# Patient Record
Sex: Female | Born: 1960 | Race: White | Hispanic: No | Marital: Married | Smoking: Former smoker
Health system: Southern US, Community
[De-identification: ages and names within clinical notes are randomized; demographics above are authoritative.]

## PROBLEM LIST (undated history)

## (undated) DIAGNOSIS — K219 Gastro-esophageal reflux disease without esophagitis: Secondary | ICD-10-CM

## (undated) DIAGNOSIS — F419 Anxiety disorder, unspecified: Secondary | ICD-10-CM

## (undated) DIAGNOSIS — F32A Depression, unspecified: Secondary | ICD-10-CM

## (undated) DIAGNOSIS — F329 Major depressive disorder, single episode, unspecified: Secondary | ICD-10-CM

## (undated) DIAGNOSIS — M1990 Unspecified osteoarthritis, unspecified site: Secondary | ICD-10-CM

## (undated) DIAGNOSIS — Z8719 Personal history of other diseases of the digestive system: Secondary | ICD-10-CM

## (undated) DIAGNOSIS — E039 Hypothyroidism, unspecified: Secondary | ICD-10-CM

## (undated) DIAGNOSIS — G4700 Insomnia, unspecified: Secondary | ICD-10-CM

## (undated) HISTORY — PX: APPENDECTOMY: SHX54

## (undated) HISTORY — PX: CARPAL TUNNEL RELEASE: SHX101

## (undated) HISTORY — PX: SHOULDER ARTHROSCOPY: SHX128

## (undated) HISTORY — PX: COLONOSCOPY W/ BIOPSIES AND POLYPECTOMY: SHX1376

## (undated) HISTORY — PX: TARSAL TUNNEL RELEASE: SUR1099

## (undated) HISTORY — PX: PLANTAR FASCIA SURGERY: SHX746

## (undated) HISTORY — PX: CHOLECYSTECTOMY: SHX55

## (undated) HISTORY — PX: ABDOMINAL HYSTERECTOMY: SHX81

## (undated) HISTORY — PX: TONSILLECTOMY: SUR1361

## (undated) HISTORY — PX: KNEE ARTHROSCOPY: SHX127

---
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
## 1983-01-13 HISTORY — PX: TUBAL LIGATION: SHX77

## 2009-05-07 ENCOUNTER — Ambulatory Visit (HOSPITAL_COMMUNITY): Admission: RE | Admit: 2009-05-07 | Discharge: 2009-05-07 | Payer: Self-pay | Admitting: Cardiovascular Disease

## 2009-05-07 ENCOUNTER — Encounter (INDEPENDENT_AMBULATORY_CARE_PROVIDER_SITE_OTHER): Payer: Self-pay | Admitting: Cardiovascular Disease

## 2011-01-13 HISTORY — PX: ROUX-EN-Y GASTRIC BYPASS: SHX1104

## 2011-01-13 HISTORY — PX: HERNIA REPAIR: SHX51

## 2012-01-13 HISTORY — PX: BUNIONECTOMY WITH HAMMERTOE RECONSTRUCTION: SHX5600

## 2013-02-08 ENCOUNTER — Other Ambulatory Visit: Payer: Self-pay | Admitting: Orthopedic Surgery

## 2013-02-14 ENCOUNTER — Encounter (HOSPITAL_COMMUNITY): Payer: Self-pay | Admitting: Pharmacy Technician

## 2013-02-17 ENCOUNTER — Inpatient Hospital Stay (HOSPITAL_COMMUNITY)
Admission: RE | Admit: 2013-02-17 | Discharge: 2013-02-17 | Disposition: A | Discharge status: Home / Self Care | Payer: Self-pay | Source: Ambulatory Visit

## 2013-02-17 NOTE — Pre-Procedure Instructions (Signed)
Deon Pillingancy Conyer  02/17/2013   Your procedure is scheduled on:   Monday 02/27/13    [facility]  [facility] 3 at 530 AM.  Call this number if you have problems the morning of surgery: [phone]   Remember:   Do not eat food or drink liquids after midnight.   Take these medicines the morning of surgery with A SIP OF WATER:  LEXAPRO, HYDROCODONE, OMEPRAZOLE   Do not wear jewelry, make-up or nail polish.  Do not wear lotions, powders, or perfumes. You may wear deodorant.  Do not shave 48 hours prior to surgery. Men may shave face and neck.  Do not bring valuables to the hospital.  [facility] is not responsible                  for any belongings or valuables.               Contacts, dentures or bridgework may not be worn into surgery.  Leave suitcase in the car. After surgery it may be brought to your room.  For patients admitted to the hospital, discharge time is determined by your                treatment team.               Patients discharged the day of surgery will not be allowed to drive  home.  Name and phone number of your driver:   Special Instructions: [place] - Preparing for Surgery  Before surgery, you can play an important role.  Because skin is not sterile, your skin needs to be as free of germs as possible.  You can reduce the number of germs on you skin by washing with CHG (chlorahexidine gluconate) soap before surgery.  CHG is an antiseptic cleaner which kills germs and bonds with the skin to continue killing germs even after washing.  Please DO NOT use if you have an allergy to CHG or antibacterial soaps.  If your skin becomes reddened/irritated stop using the CHG and inform your nurse when you arrive at Short Stay.  Do not shave (including legs and underarms) for at least 48 hours prior to the first CHG shower.  You may shave your face.  Please follow these instructions carefully:   1.  Shower with CHG Soap the night before surgery and  the morning of Surgery.  2.  If you choose to wash your hair, wash your hair first as usual with your normal shampoo.  3.  After you shampoo, rinse your hair and body thoroughly to remove the Shampoo.  4.  Use CHG as you would any other liquid soap. You can apply chg directly to the skin and wash gently with scrungie or a clean washcloth.  5.  Apply the CHG Soap to your body ONLY FROM THE NECK DOWN.  Do not use on open wounds or open sores.  Avoid contact with your eyes, ears, mouth and genitals (private parts).  Wash genitals (private parts with your normal soap.  6.  Wash thoroughly, paying special attention to the area where your surgery will be performed.  7.  Thoroughly rinse your body with warm water from the neck down.  8.  DO NOT shower/wash with your normal soap after using and rinsing off the CHG Soap.  9.  Pat yourself dry with a clean towel.            10.  Wear clean pajamas.            11.  Place clean sheets on your bed the night of your first shower and do not sleep with pets.  Day of Surgery  Do not apply any lotions/deodorants the morning of surgery.  Please wear clean clothes to the hospital/surgery center.    Please read over the following fact sheets that you were given: Pain Booklet, Coughing and Deep Breathing, Total Joint Packet, MRSA Information and Surgical Site Infection Prevention

## 2013-02-21 ENCOUNTER — Inpatient Hospital Stay (HOSPITAL_COMMUNITY): Admission: RE | Admit: 2013-02-21 | Payer: BC Managed Care – PPO | Source: Ambulatory Visit

## 2013-02-22 ENCOUNTER — Encounter (HOSPITAL_COMMUNITY)
Admission: RE | Admit: 2013-02-22 | Discharge: 2013-02-22 | Disposition: A | Discharge status: Home / Self Care | Payer: BC Managed Care – PPO | Source: Ambulatory Visit | Attending: Orthopedic Surgery | Admitting: Orthopedic Surgery

## 2013-02-22 ENCOUNTER — Encounter (HOSPITAL_COMMUNITY): Payer: Self-pay

## 2013-02-22 DIAGNOSIS — Z01812 Encounter for preprocedural laboratory examination: Secondary | ICD-10-CM | POA: Insufficient documentation

## 2013-02-22 DIAGNOSIS — Z01810 Encounter for preprocedural cardiovascular examination: Secondary | ICD-10-CM | POA: Insufficient documentation

## 2013-02-22 DIAGNOSIS — Z01818 Encounter for other preprocedural examination: Secondary | ICD-10-CM | POA: Insufficient documentation

## 2013-02-22 HISTORY — DX: Unspecified osteoarthritis, unspecified site: M19.90

## 2013-02-22 HISTORY — DX: Anxiety disorder, unspecified: F41.9

## 2013-02-22 HISTORY — DX: Insomnia, unspecified: G47.00

## 2013-02-22 HISTORY — DX: Gastro-esophageal reflux disease without esophagitis: K21.9

## 2013-02-22 HISTORY — DX: Major depressive disorder, single episode, unspecified: F32.9

## 2013-02-22 HISTORY — DX: Depression, unspecified: F32.A

## 2013-02-22 LAB — COMPREHENSIVE METABOLIC PANEL
ALT: 29 U/L (ref 0–35)
AST: 32 U/L (ref 0–37)
Albumin: 4.2 g/dL (ref 3.5–5.2)
Alkaline Phosphatase: 65 U/L (ref 39–117)
BUN: 11 mg/dL (ref 6–23)
CALCIUM: 8.9 mg/dL (ref 8.4–10.5)
CO2: 26 mEq/L (ref 19–32)
Chloride: 104 mEq/L (ref 96–112)
Creatinine, Ser: 0.54 mg/dL (ref 0.50–1.10)
GFR calc non Af Amer: >90 mL/min (ref >90)
Glucose, Bld: 82 mg/dL (ref 70–99)
Potassium: 4.5 mEq/L (ref 3.7–5.3)
Sodium: 143 mEq/L (ref 137–147)
TOTAL PROTEIN: 7.1 g/dL (ref 6.0–8.3)
Total Bilirubin: 0.4 mg/dL (ref 0.3–1.2)

## 2013-02-22 LAB — URINALYSIS, ROUTINE W REFLEX MICROSCOPIC
Bilirubin Urine: NEGATIVE
Glucose, UA: NEGATIVE mg/dL
HGB URINE DIPSTICK: NEGATIVE
Ketones, ur: NEGATIVE mg/dL
Leukocytes, UA: NEGATIVE
NITRITE: NEGATIVE
PROTEIN: NEGATIVE mg/dL
Specific Gravity, Urine: 1.007 (ref 1.005–1.030)
UROBILINOGEN UA: 0.2 mg/dL (ref 0.0–1.0)
pH: 7 (ref 5.0–8.0)

## 2013-02-22 LAB — TYPE AND SCREEN
ABO/RH(D): A POS
Antibody Screen: NEGATIVE

## 2013-02-22 LAB — CBC WITH DIFFERENTIAL/PLATELET
BASOS PCT: 1 % (ref 0–1)
Basophils Absolute: 0.1 10*3/uL (ref 0.0–0.1)
EOS ABS: 0.4 10*3/uL (ref 0.0–0.7)
EOS PCT: 5 % (ref 0–5)
HCT: 36.7 % (ref 36.0–46.0)
Hemoglobin: 12.7 g/dL (ref 12.0–15.0)
LYMPHS ABS: 2.5 10*3/uL (ref 0.7–4.0)
Lymphocytes Relative: 37 % (ref 12–46)
MCH: 31.7 pg (ref 26.0–34.0)
MCHC: 34.6 g/dL (ref 30.0–36.0)
MCV: 91.5 fL (ref 78.0–100.0)
MONOS PCT: 5 % (ref 3–12)
Monocytes Absolute: 0.4 10*3/uL (ref 0.1–1.0)
NEUTROS PCT: 52 % (ref 43–77)
Neutro Abs: 3.5 10*3/uL (ref 1.7–7.7)
Platelets: 300 10*3/uL (ref 150–400)
RBC: 4.01 MIL/uL (ref 3.87–5.11)
RDW: 13 % (ref 11.5–15.5)
WBC: 6.8 10*3/uL (ref 4.0–10.5)

## 2013-02-22 LAB — ABO/RH: ABO/RH(D): A POS

## 2013-02-22 LAB — SURGICAL PCR SCREEN
MRSA, PCR: NEGATIVE
STAPHYLOCOCCUS AUREUS: NEGATIVE

## 2013-02-22 LAB — PROTIME-INR
INR: 0.97 (ref 0.00–1.49)
Prothrombin Time: 12.7 seconds (ref 11.6–15.2)

## 2013-02-22 LAB — APTT: aPTT: 26 seconds (ref 24–37)

## 2013-02-22 NOTE — Pre-Procedure Instructions (Signed)
Courtney Morton  02/22/2013   Your procedure is scheduled on:   Monday 02/27/13    Report to [facility] Short Stay (use Main Entrance "A') at 530 AM.  Call this number if you have problems the morning of surgery: [phone]   Remember:   Do not eat food or drink liquids after midnight.   Take these medicines the morning of surgery with A SIP OF WATER:  LEXAPRO, OMEPRAZOLE If needed: HYDROcodone-acetaminophen (NORCO/VICODIN) 5-325 MG per tablet for pain Stop taking Aspirin, Multivitamins and herbal medications. Do not take any NSAIDs ie: Ibuprofen, Advil, Naproxen or any medication containing Aspirin.  Do not wear jewelry, make-up or nail polish.  Do not wear lotions, powders, or perfumes.   Do not shave 48 hours prior to surgery.   Do not bring valuables to the hospital.  [facility] is not responsible for any belongings or valuables.               Contacts, dentures or bridgework may not be worn into surgery.  Leave suitcase in the car. After surgery it may be brought to your room.  For patients admitted to the hospital, discharge time is determined by your treatment team.               Patients discharged the day of surgery will not be allowed to drive home.  Name and phone number of your driver:   Special Instructions: [place] - Preparing for Surgery  Before surgery, you can play an important role.  Because skin is not sterile, your skin needs to be as free of germs as possible.  You can reduce the number of germs on you skin by washing with CHG (chlorahexidine gluconate) soap before surgery.  CHG is an antiseptic cleaner which kills germs and bonds with the skin to continue killing germs even after washing.  Please DO NOT use if you have an allergy to CHG or antibacterial soaps.  If your skin becomes reddened/irritated stop using the CHG and inform your nurse when you arrive at Short Stay.  Do not shave (including legs and underarms) for at least 48 hours prior to the first CHG  shower.  You may shave your face.  Please follow these instructions carefully:   1.  Shower with CHG Soap the night before surgery and the morning of Surgery.  2.  If you choose to wash your hair, wash your hair first as usual with your normal shampoo.  3.  After you shampoo, rinse your hair and body thoroughly to remove the Shampoo.  4.  Use CHG as you would any other liquid soap. You can apply chg directly to the skin and wash gently with scrungie or a clean washcloth.  5.  Apply the CHG Soap to your body ONLY FROM THE NECK DOWN.  Do not use on open wounds or open sores.  Avoid contact with your eyes, ears, mouth and genitals (private parts).  Wash genitals (private parts with your normal soap.  6.  Wash thoroughly, paying special attention to the area where your surgery will be performed.  7.  Thoroughly rinse your body with warm water from the neck down.  8.  DO NOT shower/wash with your normal soap after using and rinsing off the CHG Soap.  9.  Pat yourself dry with a clean towel.            10.  Wear clean pajamas.  11.  Place clean sheets on your bed the night of your first shower and do not sleep with pets.  Day of Surgery  Do not apply any lotions/deodorants the morning of surgery.  Please wear clean clothes to the hospital/surgery center.    Please read over the following fact sheets that you were given: Pain Booklet, Coughing and Deep Breathing, Total Joint Packet, MRSA Information and Surgical Site Infection Prevention

## 2013-02-23 LAB — URINE CULTURE: Colony Count: 1000

## 2013-02-26 MED ORDER — CHLORHEXIDINE GLUCONATE 4 % EX LIQD
60.0000 mL | Freq: Once | CUTANEOUS | Status: DC
  Filled 2013-02-26: qty 60

## 2013-02-26 MED ORDER — TRANEXAMIC ACID 100 MG/ML IV SOLN
1000.0000 mg | INTRAVENOUS | Status: AC
  Administered 2013-02-27: 1000 mg via INTRAVENOUS
  Filled 2013-02-26: qty 10

## 2013-02-26 MED ORDER — CEFAZOLIN SODIUM-DEXTROSE 2-3 GM-% IV SOLR: 2.0000 g | INTRAVENOUS | Status: DC

## 2013-02-26 MED ORDER — BUPIVACAINE LIPOSOME 1.3 % IJ SUSP
20.0000 mL | Freq: Once | INTRAMUSCULAR | Status: DC
  Filled 2013-02-26: qty 20

## 2013-02-26 MED ORDER — SODIUM CHLORIDE 0.9 % IV SOLN: INTRAVENOUS | Status: DC

## 2013-02-27 ENCOUNTER — Other Ambulatory Visit: Payer: Self-pay | Admitting: Orthopedic Surgery

## 2013-02-27 ENCOUNTER — Encounter (HOSPITAL_COMMUNITY)
Admission: RE | Disposition: A | Discharge status: Home Health | Payer: Self-pay | Source: Ambulatory Visit | Attending: Orthopedic Surgery

## 2013-02-27 ENCOUNTER — Inpatient Hospital Stay (HOSPITAL_COMMUNITY)
Admission: RE | Admit: 2013-02-27 | Discharge: 2013-03-01 | DRG: 470 | Disposition: A | Discharge status: Home Health | Payer: BC Managed Care – PPO | Source: Ambulatory Visit | Attending: Orthopedic Surgery | Admitting: Orthopedic Surgery

## 2013-02-27 ENCOUNTER — Encounter (HOSPITAL_COMMUNITY): Payer: BC Managed Care – PPO | Admitting: Anesthesiology

## 2013-02-27 ENCOUNTER — Encounter (HOSPITAL_COMMUNITY): Payer: Self-pay | Admitting: Anesthesiology

## 2013-02-27 ENCOUNTER — Ambulatory Visit (HOSPITAL_COMMUNITY): Payer: BC Managed Care – PPO | Admitting: Anesthesiology

## 2013-02-27 DIAGNOSIS — F329 Major depressive disorder, single episode, unspecified: Secondary | ICD-10-CM | POA: Diagnosis present

## 2013-02-27 DIAGNOSIS — Z87891 Personal history of nicotine dependence: Secondary | ICD-10-CM

## 2013-02-27 DIAGNOSIS — Z79899 Other long term (current) drug therapy: Secondary | ICD-10-CM

## 2013-02-27 DIAGNOSIS — M1710 Unilateral primary osteoarthritis, unspecified knee: Principal | ICD-10-CM | POA: Diagnosis present

## 2013-02-27 DIAGNOSIS — Z96659 Presence of unspecified artificial knee joint: Secondary | ICD-10-CM

## 2013-02-27 DIAGNOSIS — F3289 Other specified depressive episodes: Secondary | ICD-10-CM | POA: Diagnosis present

## 2013-02-27 DIAGNOSIS — F411 Generalized anxiety disorder: Secondary | ICD-10-CM | POA: Diagnosis present

## 2013-02-27 DIAGNOSIS — D62 Acute posthemorrhagic anemia: Secondary | ICD-10-CM | POA: Diagnosis not present

## 2013-02-27 DIAGNOSIS — Z9884 Bariatric surgery status: Secondary | ICD-10-CM

## 2013-02-27 DIAGNOSIS — K219 Gastro-esophageal reflux disease without esophagitis: Secondary | ICD-10-CM | POA: Diagnosis present

## 2013-02-27 HISTORY — PX: TOTAL KNEE ARTHROPLASTY: SHX125

## 2013-02-27 HISTORY — DX: Personal history of other diseases of the digestive system: Z87.19

## 2013-02-27 HISTORY — DX: Hypothyroidism, unspecified: E03.9

## 2013-02-27 LAB — CREATININE, SERUM
Creatinine, Ser: 0.66 mg/dL (ref 0.50–1.10)
GFR calc Af Amer: >90 mL/min
GFR calc non Af Amer: >90 mL/min

## 2013-02-27 LAB — CBC
HCT: 32.8 % — ABNORMAL LOW (ref 36.0–46.0)
HEMOGLOBIN: 11 g/dL — AB (ref 12.0–15.0)
MCH: 31.3 pg (ref 26.0–34.0)
MCHC: 33.5 g/dL (ref 30.0–36.0)
MCV: 93.2 fL (ref 78.0–100.0)
Platelets: 239 10*3/uL (ref 150–400)
RBC: 3.52 MIL/uL — ABNORMAL LOW (ref 3.87–5.11)
RDW: 13.3 % (ref 11.5–15.5)
WBC: 11.2 10*3/uL — AB (ref 4.0–10.5)

## 2013-02-27 SURGERY — ARTHROPLASTY, KNEE, TOTAL
Anesthesia: General | Site: Knee | Laterality: Left

## 2013-02-27 MED ORDER — BUPIVACAINE-EPINEPHRINE 0.5% -1:200000 IJ SOLN
INTRAMUSCULAR | Status: DC | PRN
  Administered 2013-02-27: 30 mL

## 2013-02-27 MED ORDER — CEFAZOLIN SODIUM-DEXTROSE 2-3 GM-% IV SOLR
INTRAVENOUS | Status: DC | PRN
  Administered 2013-02-27: 2 g via INTRAVENOUS

## 2013-02-27 MED ORDER — ONDANSETRON HCL 4 MG/2ML IJ SOLN: 4.0000 mg | Freq: Once | INTRAMUSCULAR | Status: DC | PRN

## 2013-02-27 MED ORDER — MIDAZOLAM HCL 2 MG/2ML IJ SOLN
INTRAMUSCULAR | Status: AC
  Filled 2013-02-27: qty 2

## 2013-02-27 MED ORDER — LIDOCAINE HCL (CARDIAC) 20 MG/ML IV SOLN
INTRAVENOUS | Status: AC
  Filled 2013-02-27: qty 5

## 2013-02-27 MED ORDER — ONDANSETRON HCL 4 MG/2ML IJ SOLN
INTRAMUSCULAR | Status: DC | PRN
  Administered 2013-02-27: 4 mg via INTRAVENOUS

## 2013-02-27 MED ORDER — ESCITALOPRAM OXALATE 20 MG PO TABS
20.0000 mg | ORAL_TABLET | Freq: Every day | ORAL | Status: DC
  Administered 2013-02-27 – 2013-02-28 (×2): 20 mg via ORAL
  Filled 2013-02-27 (×3): qty 1

## 2013-02-27 MED ORDER — CHLORHEXIDINE GLUCONATE 4 % EX LIQD
60.0000 mL | Freq: Once | CUTANEOUS | Status: DC
  Filled 2013-02-27: qty 60

## 2013-02-27 MED ORDER — BISACODYL 5 MG PO TBEC: 5.0000 mg | DELAYED_RELEASE_TABLET | Freq: Every day | ORAL | Status: DC | PRN

## 2013-02-27 MED ORDER — ALUM & MAG HYDROXIDE-SIMETH 200-200-20 MG/5ML PO SUSP
30.0000 mL | ORAL | Status: DC | PRN
Start: 2013-02-27 — End: 2013-03-01

## 2013-02-27 MED ORDER — FENTANYL CITRATE 0.05 MG/ML IJ SOLN
INTRAMUSCULAR | Status: AC
  Filled 2013-02-27: qty 5

## 2013-02-27 MED ORDER — OXYCODONE HCL 5 MG PO TABS
5.0000 mg | ORAL_TABLET | ORAL | Status: DC | PRN
Start: 2013-02-27 — End: 2013-03-01
  Administered 2013-02-27 – 2013-03-01 (×13): 10 mg via ORAL
  Filled 2013-02-27 (×13): qty 2

## 2013-02-27 MED ORDER — CEFAZOLIN SODIUM 1-5 GM-% IV SOLN
1.0000 g | Freq: Four times a day (QID) | INTRAVENOUS | Status: AC
  Administered 2013-02-27 (×2): 1 g via INTRAVENOUS
  Filled 2013-02-27 (×3): qty 50

## 2013-02-27 MED ORDER — ENOXAPARIN SODIUM 30 MG/0.3ML ~~LOC~~ SOLN
30.0000 mg | Freq: Two times a day (BID) | SUBCUTANEOUS | Status: DC
  Administered 2013-02-28 – 2013-03-01 (×3): 30 mg via SUBCUTANEOUS
  Filled 2013-02-27 (×5): qty 0.3

## 2013-02-27 MED ORDER — FLEET ENEMA 7-19 GM/118ML RE ENEM: 1.0000 | ENEMA | Freq: Once | RECTAL | Status: AC | PRN

## 2013-02-27 MED ORDER — PANTOPRAZOLE SODIUM 40 MG PO TBEC
40.0000 mg | DELAYED_RELEASE_TABLET | Freq: Every day | ORAL | Status: DC
  Administered 2013-02-28: 40 mg via ORAL
  Filled 2013-02-27: qty 1

## 2013-02-27 MED ORDER — METHOCARBAMOL 500 MG PO TABS
500.0000 mg | ORAL_TABLET | Freq: Four times a day (QID) | ORAL | Status: DC | PRN
  Administered 2013-02-27 – 2013-03-01 (×7): 500 mg via ORAL
  Filled 2013-02-27 (×7): qty 1

## 2013-02-27 MED ORDER — SODIUM CHLORIDE 0.9 % IR SOLN
Status: DC | PRN
  Administered 2013-02-27 (×2): 1000 mL

## 2013-02-27 MED ORDER — LIDOCAINE HCL (CARDIAC) 20 MG/ML IV SOLN
INTRAVENOUS | Status: DC | PRN
  Administered 2013-02-27: 80 mg via INTRAVENOUS

## 2013-02-27 MED ORDER — BUPIVACAINE LIPOSOME 1.3 % IJ SUSP
INTRAMUSCULAR | Status: DC | PRN
  Administered 2013-02-27: 20 mL

## 2013-02-27 MED ORDER — INFLUENZA VAC SPLIT QUAD 0.5 ML IM SUSP
0.5000 mL | INTRAMUSCULAR | Status: AC
  Administered 2013-02-28: 0.5 mL via INTRAMUSCULAR
  Filled 2013-02-27: qty 0.5

## 2013-02-27 MED ORDER — HYDROMORPHONE HCL PF 1 MG/ML IJ SOLN
INTRAMUSCULAR | Status: AC
  Filled 2013-02-27: qty 1

## 2013-02-27 MED ORDER — METHOCARBAMOL 500 MG PO TABS
ORAL_TABLET | ORAL | Status: AC
  Filled 2013-02-27: qty 1

## 2013-02-27 MED ORDER — BUPIVACAINE-EPINEPHRINE (PF) 0.5% -1:200000 IJ SOLN
INTRAMUSCULAR | Status: AC
  Filled 2013-02-27: qty 10

## 2013-02-27 MED ORDER — METOCLOPRAMIDE HCL 5 MG/ML IJ SOLN
5.0000 mg | Freq: Three times a day (TID) | INTRAMUSCULAR | Status: DC | PRN
  Filled 2013-02-27: qty 2

## 2013-02-27 MED ORDER — HYDROMORPHONE HCL PF 1 MG/ML IJ SOLN
1.0000 mg | INTRAMUSCULAR | Status: DC | PRN
  Administered 2013-02-27 – 2013-02-28 (×9): 1 mg via INTRAVENOUS
  Filled 2013-02-27 (×9): qty 1

## 2013-02-27 MED ORDER — HYDROMORPHONE HCL PF 1 MG/ML IJ SOLN
0.2500 mg | INTRAMUSCULAR | Status: DC | PRN
  Administered 2013-02-27 (×3): 0.5 mg via INTRAVENOUS

## 2013-02-27 MED ORDER — OXYCODONE HCL ER 10 MG PO T12A
10.0000 mg | EXTENDED_RELEASE_TABLET | Freq: Two times a day (BID) | ORAL | Status: DC
Start: 2013-02-27 — End: 2013-03-01
  Administered 2013-02-27 – 2013-03-01 (×5): 10 mg via ORAL
  Filled 2013-02-27 (×5): qty 1

## 2013-02-27 MED ORDER — DOCUSATE SODIUM 100 MG PO CAPS
100.0000 mg | ORAL_CAPSULE | Freq: Two times a day (BID) | ORAL | Status: DC
  Administered 2013-02-27 – 2013-02-28 (×3): 100 mg via ORAL
  Filled 2013-02-27 (×5): qty 1

## 2013-02-27 MED ORDER — SODIUM CHLORIDE 0.9 % IV SOLN
INTRAVENOUS | Status: DC
  Administered 2013-02-27: 18:00:00 via INTRAVENOUS

## 2013-02-27 MED ORDER — ONDANSETRON HCL 4 MG/2ML IJ SOLN
INTRAMUSCULAR | Status: AC
  Filled 2013-02-27: qty 2

## 2013-02-27 MED ORDER — ESTRADIOL 0.05 MG/24HR TD PTWK: 0.0500 mg | MEDICATED_PATCH | TRANSDERMAL | Status: DC

## 2013-02-27 MED ORDER — MENTHOL 3 MG MT LOZG: 1.0000 | LOZENGE | OROMUCOSAL | Status: DC | PRN

## 2013-02-27 MED ORDER — FENTANYL CITRATE 0.05 MG/ML IJ SOLN
INTRAMUSCULAR | Status: DC | PRN
  Administered 2013-02-27: 25 ug via INTRAVENOUS
  Administered 2013-02-27: 50 ug via INTRAVENOUS
  Administered 2013-02-27 (×2): 100 ug via INTRAVENOUS
  Administered 2013-02-27: 50 ug via INTRAVENOUS
  Administered 2013-02-27: 100 ug via INTRAVENOUS
  Administered 2013-02-27: 25 ug via INTRAVENOUS
  Administered 2013-02-27: 50 ug via INTRAVENOUS

## 2013-02-27 MED ORDER — DIPHENHYDRAMINE HCL 12.5 MG/5ML PO ELIX
12.5000 mg | ORAL_SOLUTION | ORAL | Status: DC | PRN
  Administered 2013-02-28: 25 mg via ORAL
  Filled 2013-02-27: qty 10

## 2013-02-27 MED ORDER — PHENOL 1.4 % MT LIQD: 1.0000 | OROMUCOSAL | Status: DC | PRN

## 2013-02-27 MED ORDER — SENNOSIDES-DOCUSATE SODIUM 8.6-50 MG PO TABS: 1.0000 | ORAL_TABLET | Freq: Every evening | ORAL | Status: DC | PRN

## 2013-02-27 MED ORDER — LACTATED RINGERS IV SOLN
INTRAVENOUS | Status: DC | PRN
  Administered 2013-02-27: 07:00:00 via INTRAVENOUS

## 2013-02-27 MED ORDER — MIDAZOLAM HCL 5 MG/5ML IJ SOLN
INTRAMUSCULAR | Status: DC | PRN
  Administered 2013-02-27: 2 mg via INTRAVENOUS

## 2013-02-27 MED ORDER — ALPRAZOLAM 0.25 MG PO TABS
0.2500 mg | ORAL_TABLET | Freq: Every evening | ORAL | Status: DC | PRN
  Administered 2013-02-27 – 2013-02-28 (×2): 0.25 mg via ORAL
  Filled 2013-02-27 (×2): qty 1

## 2013-02-27 MED ORDER — METOCLOPRAMIDE HCL 5 MG PO TABS
5.0000 mg | ORAL_TABLET | Freq: Three times a day (TID) | ORAL | Status: DC | PRN
  Filled 2013-02-27: qty 2

## 2013-02-27 MED ORDER — PROPOFOL 10 MG/ML IV BOLUS
INTRAVENOUS | Status: DC | PRN
  Administered 2013-02-27: 200 mg via INTRAVENOUS

## 2013-02-27 MED ORDER — ACETAMINOPHEN 325 MG PO TABS
650.0000 mg | ORAL_TABLET | Freq: Four times a day (QID) | ORAL | Status: DC | PRN
Start: 2013-02-27 — End: 2013-03-01
  Administered 2013-02-27 – 2013-03-01 (×5): 650 mg via ORAL
  Filled 2013-02-27 (×5): qty 2

## 2013-02-27 MED ORDER — PROPOFOL 10 MG/ML IV BOLUS
INTRAVENOUS | Status: AC
  Filled 2013-02-27: qty 20

## 2013-02-27 MED ORDER — ACETAMINOPHEN 650 MG RE SUPP: 650.0000 mg | Freq: Four times a day (QID) | RECTAL | Status: DC | PRN

## 2013-02-27 MED ORDER — ONDANSETRON HCL 4 MG PO TABS: 4.0000 mg | ORAL_TABLET | Freq: Four times a day (QID) | ORAL | Status: DC | PRN

## 2013-02-27 MED ORDER — METHOCARBAMOL 100 MG/ML IJ SOLN
500.0000 mg | Freq: Four times a day (QID) | INTRAVENOUS | Status: DC | PRN
  Filled 2013-02-27: qty 5

## 2013-02-27 MED ORDER — ROCURONIUM BROMIDE 50 MG/5ML IV SOLN
INTRAVENOUS | Status: AC
  Filled 2013-02-27: qty 1

## 2013-02-27 MED ORDER — ONDANSETRON HCL 4 MG/2ML IJ SOLN: 4.0000 mg | Freq: Four times a day (QID) | INTRAMUSCULAR | Status: DC | PRN

## 2013-02-27 SURGICAL SUPPLY — 54 items
BANDAGE ESMARK 6X9 LF (GAUZE/BANDAGES/DRESSINGS) ×1 IMPLANT
BLADE SAGITTAL 13X1.27X60 (BLADE) ×2 IMPLANT
BLADE SAGITTAL 13X1.27X60MM (BLADE) ×1
BLADE SAW SGTL 83.5X18.5 (BLADE) ×3 IMPLANT
BNDG ESMARK 6X9 LF (GAUZE/BANDAGES/DRESSINGS) ×3
BOWL SMART MIX CTS (DISPOSABLE) ×3 IMPLANT
CAP POR TM CP VIT E LN CER HD ×3 IMPLANT
CEMENT BONE SIMPLEX SPEEDSET (Cement) ×6 IMPLANT
COVER SURGICAL LIGHT HANDLE (MISCELLANEOUS) ×3 IMPLANT
CUFF TOURNIQUET SINGLE 34IN LL (TOURNIQUET CUFF) ×3 IMPLANT
DRAPE EXTREMITY T 121X128X90 (DRAPE) ×3 IMPLANT
DRAPE INCISE IOBAN 66X45 STRL (DRAPES) ×6 IMPLANT
DRAPE PROXIMA HALF (DRAPES) ×3 IMPLANT
DRAPE U-SHAPE 47X51 STRL (DRAPES) ×3 IMPLANT
DRSG ADAPTIC 3X8 NADH LF (GAUZE/BANDAGES/DRESSINGS) ×3 IMPLANT
DRSG PAD ABDOMINAL 8X10 ST (GAUZE/BANDAGES/DRESSINGS) ×3 IMPLANT
DURAPREP 26ML APPLICATOR (WOUND CARE) ×6 IMPLANT
ELECT REM PT RETURN 9FT ADLT (ELECTROSURGICAL) ×3
ELECTRODE REM PT RTRN 9FT ADLT (ELECTROSURGICAL) ×1 IMPLANT
EVACUATOR 1/8 PVC DRAIN (DRAIN) ×3 IMPLANT
GLOVE BIOGEL M 7.0 STRL (GLOVE) IMPLANT
GLOVE BIOGEL PI IND STRL 7.5 (GLOVE) IMPLANT
GLOVE BIOGEL PI IND STRL 8.5 (GLOVE) ×2 IMPLANT
GLOVE BIOGEL PI INDICATOR 7.5 (GLOVE)
GLOVE BIOGEL PI INDICATOR 8.5 (GLOVE) ×4
GLOVE SURG ORTHO 8.0 STRL STRW (GLOVE) ×6 IMPLANT
GOWN PREVENTION PLUS XLARGE (GOWN DISPOSABLE) ×6 IMPLANT
GOWN STRL NON-REIN LRG LVL3 (GOWN DISPOSABLE) ×6 IMPLANT
HANDPIECE INTERPULSE COAX TIP (DISPOSABLE) ×2
HOOD PEEL AWAY FACE SHEILD DIS (HOOD) ×12 IMPLANT
KIT BASIN OR (CUSTOM PROCEDURE TRAY) ×3 IMPLANT
KIT ROOM TURNOVER OR (KITS) ×3 IMPLANT
MANIFOLD NEPTUNE II (INSTRUMENTS) ×3 IMPLANT
NEEDLE 22X1 1/2 (OR ONLY) (NEEDLE) ×3 IMPLANT
NS IRRIG 1000ML POUR BTL (IV SOLUTION) ×3 IMPLANT
PACK TOTAL JOINT (CUSTOM PROCEDURE TRAY) ×3 IMPLANT
PAD ARMBOARD 7.5X6 YLW CONV (MISCELLANEOUS) ×6 IMPLANT
PADDING CAST COTTON 6X4 STRL (CAST SUPPLIES) ×3 IMPLANT
SET HNDPC FAN SPRY TIP SCT (DISPOSABLE) ×1 IMPLANT
SPONGE GAUZE 4X4 12PLY (GAUZE/BANDAGES/DRESSINGS) ×3 IMPLANT
SPONGE GAUZE 4X4 12PLY STER LF (GAUZE/BANDAGES/DRESSINGS) ×3 IMPLANT
STAPLER VISISTAT 35W (STAPLE) ×3 IMPLANT
SUCTION FRAZIER TIP 10 FR DISP (SUCTIONS) ×3 IMPLANT
SUT BONE WAX W31G (SUTURE) ×3 IMPLANT
SUT VIC AB 0 CTB1 27 (SUTURE) ×6 IMPLANT
SUT VIC AB 1 CT1 27 (SUTURE) ×6
SUT VIC AB 1 CT1 27XBRD ANBCTR (SUTURE) ×3 IMPLANT
SUT VIC AB 2-0 CT1 27 (SUTURE) ×4
SUT VIC AB 2-0 CT1 TAPERPNT 27 (SUTURE) ×2 IMPLANT
SYR CONTROL 10ML LL (SYRINGE) ×3 IMPLANT
TOWEL OR 17X24 6PK STRL BLUE (TOWEL DISPOSABLE) ×3 IMPLANT
TOWEL OR 17X26 10 PK STRL BLUE (TOWEL DISPOSABLE) ×3 IMPLANT
TRAY FOLEY CATH 14FR (SET/KITS/TRAYS/PACK) ×3 IMPLANT
WATER STERILE IRR 1000ML POUR (IV SOLUTION) ×6 IMPLANT

## 2013-02-27 NOTE — Anesthesia Procedure Notes (Signed)
Anesthesia Regional Block:  Femoral nerve block  Pre-Anesthetic Checklist: ,, timeout performed, Correct Patient, Correct Site, Correct Laterality, Correct Procedure, Correct Position, site marked, Risks and benefits discussed,  Surgical consent,  Pre-op evaluation,  At surgeon's request and post-op pain management  Laterality: Left  Prep: chloraprep       Needles:  Injection technique: Single-shot  Needle Type: Stimulator Needle - 80        Needle insertion depth: 5 cm   Additional Needles:  Procedures: nerve stimulator Femoral nerve block  Nerve Stimulator or Paresthesia:  Response: 0.5 mA, 0.1 ms, 5 cm  Additional Responses:   Narrative:  Start time: 02/27/2013 7:00 AM End time: 02/27/2013 7:05 AM Injection made incrementally with aspirations every 5 mL.  Performed by: Personally  Anesthesiologist: Maren BeachGregory E Pia Jedlicka MD  Additional Notes: Pt accepts procedure w/ risks.15cc 0.5% Marcaine w/ epi w/o difficulty . Pt tolerated well. GES

## 2013-02-27 NOTE — Transfer of Care (Signed)
Immediate Anesthesia Transfer of Care Note  Patient: Courtney PillingNancy Ent  Procedure(s) Performed: Procedure(s): TOTAL KNEE ARTHROPLASTY (Left)  Patient Location: PACU  Anesthesia Type:General  Level of Consciousness: awake, alert  and oriented  Airway & Oxygen Therapy: Patient Spontanous Breathing and Patient connected to nasal cannula oxygen  Post-op Assessment: Report given to PACU RN and Post -op Vital signs reviewed and stable  Post vital signs: Reviewed and stable  Complications: No apparent anesthesia complications

## 2013-02-27 NOTE — Preoperative (Signed)
Beta Blockers   Reason not to administer Beta Blockers:Not Applicable 

## 2013-02-27 NOTE — Progress Notes (Signed)
Waiting on floor to give report to Efthemios Raphtis Md PcCary RN who is the charge RN

## 2013-02-27 NOTE — H&P (Signed)
Courtney Morton MRN:  [id number] DOB/SEX:  06/15/1960/female  CHIEF COMPLAINT:  Painful left Knee  HISTORY: Patient is a 53 y.o. female presented with a history of pain in the left knee. Onset of symptoms was gradual starting several years ago with gradually worsening course since that time. Prior procedures on the knee include arthroscopy. Patient has been treated conservatively with over-the-counter NSAIDs and activity modification. Patient currently rates pain in the knee at 9 out of 10 with activity. There is pain at night.  PAST MEDICAL HISTORY: There are no active problems to display for this patient.  Past Medical History  Diagnosis Date  . OA (osteoarthritis)     Hx: of  . Anxiety   . Insomnia     Hx: of  . Depression   . GERD (gastroesophageal reflux disease)    Past Surgical History  Procedure Laterality Date  . Abdominal hysterectomy    . Appendectomy    . Tonsillectomy    . Cholecystectomy    . Gastric bypass      Hx: of  . Carpal tunnel release      Hx: of right wrist  . Plantar fascia surgery      Hx: of  . Tarsal tunnel release      Hx: of  . Bunionectomy    . Bunionectomy with hammertoe reconstruction      Hx: of left foot  . Knee arthroscopy      Hx: of B/L knees  . Goiter removed      Hx; of at 6317 months old  . Colonoscopy w/ biopsies and polypectomy      Hx:of  . Hernia repair    . Tubal ligation    . Shoulder arthroscopy      Hx: of right shoulder     MEDICATIONS:   Prescriptions prior to admission  Medication Sig Dispense Refill  . ALPRAZolam (XANAX) 0.25 MG tablet Take 0.25 mg by mouth at bedtime as needed for anxiety.      Marland Kitchen. escitalopram (LEXAPRO) 20 MG tablet Take 20 mg by mouth daily.      Marland Kitchen. estradiol (VIVELLE-DOT) 0.1 MG/24HR patch Place 1 patch onto the skin 2 (two) times a week.      Marland Kitchen. HYDROcodone-acetaminophen (NORCO/VICODIN) 5-325 MG per tablet Take 1 tablet by mouth 2 (two) times daily as needed for moderate pain.      . Multiple  Vitamins-Minerals (MULTIVITAMIN WITH MINERALS) tablet Take 1 tablet by mouth daily.      Marland Kitchen. omeprazole (PRILOSEC) 20 MG capsule Take 20 mg by mouth daily.        ALLERGIES:   Allergies  Allergen Reactions  . Aspirin     Due to gastric bypass surgery    . Nsaids     Hx of gastric bypass    REVIEW OF SYSTEMS:  Pertinent items are noted in HPI.   FAMILY HISTORY:   Family History  Problem Relation Age of Onset  . Heart disease Mother   . Lung disease Mother   . Dementia Father   . Hypertension Father   . Cancer - Other Brother     SOCIAL HISTORY:   History  Substance Use Topics  . Smoking status: Former Smoker    Types: Cigarettes  . Smokeless tobacco: Never Used     Comment: quit smoking ciigarettes 1983  . Alcohol Use: Yes     Comment: occasional      EXAMINATION:  Vital signs in last 24 hours:  General appearance: alert, cooperative and no distress Lungs: clear to auscultation bilaterally Heart: regular rate and rhythm, S1, S2 normal, no murmur, click, rub or gallop Abdomen: soft, non-tender; bowel sounds normal; no masses,  no organomegaly Extremities: extremities normal, atraumatic, no cyanosis or edema and Homans sign is negative, no sign of DVT Pulses: 2+ and symmetric Skin: Skin color, texture, turgor normal. No rashes or lesions Neurologic: Alert and oriented X 3, normal strength and tone. Normal symmetric reflexes. Normal coordination and gait  Musculoskeletal:  ROM 0-115, Ligaments intact,  Imaging Review Plain radiographs demonstrate severe degenerative joint disease of the left knee. The overall alignment is mild valgus. The bone quality appears to be good for age and reported activity level.  Assessment/Plan: End stage arthritis, left knee   The patient history, physical examination and imaging studies are consistent with advanced degenerative joint disease of the left knee. The patient has failed conservative treatment.  The clearance notes were  reviewed.  After discussion with the patient it was felt that Total Knee Replacement was indicated. The procedure,  risks, and benefits of total knee arthroplasty were presented and reviewed. The risks including but not limited to aseptic loosening, infection, blood clots, vascular injury, stiffness, patella tracking problems complications among others were discussed. The patient acknowledged the explanation, agreed to proceed with the plan.  Niemah Schwebke 02/27/2013, 7:03 AM

## 2013-02-27 NOTE — Progress Notes (Signed)
Orthopedic Tech Progress Note Patient Details:  Courtney Morton 05/08/1960 [id number]  CPM Left Knee CPM Left Knee: On Left Knee Flexion (Degrees): 90 Left Knee Extension (Degrees): 0 Additional Comments: Trapeze bar and foot roll   Shawnie PonsCammer, Courtney Morton 02/27/2013, 10:35 AM

## 2013-02-27 NOTE — Plan of Care (Signed)
Problem: Consults Goal: Diagnosis- Total Joint Replacement Primary Total Knee Left     

## 2013-02-27 NOTE — Evaluation (Signed)
Physical Therapy Evaluation Patient Details Name: Courtney Morton MRN: [id number] DOB: 11/13/1960 Today's Date: 02/27/2013 Time: [id number] PT Time Calculation (min): 32 min  PT Assessment / Plan / Recommendation History of Present Illness  Pt presents for left TKA  Clinical Impression  Pt ambulated 10' with RW and min-guard A, no nausea or dizziness. Pt with 10/10 pain in CPM before meds, 3/10 after pain meds. Pt left in chair with leg elevated in extension. Pt will benefit from acute PT to increase ROM and strength of the LLE and to promote safe and independent mobility for d/c home. Recommend HHPT at d/c.    PT Assessment  Patient needs continued PT services    Follow Up Recommendations  Home health PT;Supervision - Intermittent    Does the patient have the potential to tolerate intense rehabilitation      Barriers to Discharge        Equipment Recommendations  None recommended by PT    Recommendations for Other Services OT consult   Frequency 7X/week    Precautions / Restrictions Precautions Precautions: Knee Precaution Comments: reviewed proper positioning Restrictions Weight Bearing Restrictions: Yes LLE Weight Bearing: Weight bearing as tolerated   Pertinent Vitals/Pain VSS      Mobility  Bed Mobility Overal bed mobility: Needs Assistance Bed Mobility: Sidelying to Sit Sidelying to sit: Min assist General bed mobility comments: pt with sufficient strength to sit straight up in bed and pivot to edge. Min A to LLE for pain control Transfers Overall transfer level: Needs assistance Equipment used: Rolling walker (2 wheeled) Transfers: Sit to/from Stand Sit to Stand: Min guard General transfer comment: min-guard for safety in case pt became woozy but no physical assist required. vc's for hand placement. Ambulation/Gait Ambulation/Gait assistance: Min guard Ambulation Distance (Feet): 10 Feet Assistive device: Rolling walker (2 wheeled) Gait Pattern/deviations:  Step-to pattern;Decreased stance time - left;Decreased step length - left Gait velocity: decreased Gait velocity interpretation: Below normal speed for age/gender General Gait Details: vc's for sequencing to decrease pain. Pt able to take partial wt through LLE without buckling.    Exercises Total Joint Exercises Ankle Circles/Pumps: AROM;Both;Supine;20 reps Quad Sets: AROM;Both;10 reps;Supine   PT Diagnosis: Difficulty walking;Abnormality of gait;Acute pain  PT Problem List: Decreased strength;Decreased range of motion;Decreased mobility;Decreased knowledge of use of DME;Decreased knowledge of precautions;Pain PT Treatment Interventions: DME instruction;Gait training;Stair training;Functional mobility training;Therapeutic activities;Therapeutic exercise;Neuromuscular re-education;Patient/family education     PT Goals(Current goals can be found in the care plan section) Acute Rehab PT Goals Patient Stated Goal: return home PT Goal Formulation: With patient Time For Goal Achievement: 03/06/13 Potential to Achieve Goals: Good  Visit Information  Last PT Received On: 02/27/13 Assistance Needed: +1 History of Present Illness: Pt presents for left TKA       Prior Functioning  Home Living Family/patient expects to be discharged to:: Private residence Living Arrangements: Spouse/significant other Available Help at Discharge: Family;Available PRN/intermittently Type of Home: House Home Access: Stairs to enter Entergy CorporationEntrance Stairs-Number of Steps: 1 Entrance Stairs-Rails: None Home Layout: Two level;Able to live on main level with bedroom/bathroom Home Equipment: Dan HumphreysWalker - 2 wheels;Bedside commode Additional Comments: pt has already received DME. When pt's husband at work, her brother will be there working on their basement and can help her as needed Prior Function Level of Independence: Independent Communication Communication: No difficulties    Cognition   Cognition Arousal/Alertness: Awake/alert Behavior During Therapy: WFL for tasks assessed/performed Overall Cognitive Status: Within Functional Limits for tasks assessed  Extremity/Trunk Assessment Upper Extremity Assessment Upper Extremity Assessment: Overall WFL for tasks assessed Lower Extremity Assessment Lower Extremity Assessment: LLE deficits/detail;RLE deficits/detail RLE Deficits / Details: crepitus noted with mvmt, strength WFL LLE Deficits / Details: hip flex 2/5, knee ext 2/5, ankle WFL LLE: Unable to fully assess due to pain Cervical / Trunk Assessment Cervical / Trunk Assessment: Normal   Balance Balance Overall balance assessment: No apparent balance deficits (not formally assessed)  End of Session PT - End of Session Equipment Utilized During Treatment: Gait belt Activity Tolerance: Patient tolerated treatment well Patient left: in bed;with call bell/phone within reach;with family/visitor present Nurse Communication: Mobility status CPM Left Knee CPM Left Knee: Off Left Knee Flexion (Degrees): 90 Left Knee Extension (Degrees): 0 Additional Comments: Trapeze bar and foot roll  GP   [name], PT  Acute Rehab Services  [phone]   Pecola Leisure, [place] 02/27/2013, 2:17 PM

## 2013-02-27 NOTE — Anesthesia Preprocedure Evaluation (Addendum)
Anesthesia Evaluation  Patient identified by MRN, date of birth, ID band Patient awake    Airway       Dental   Pulmonary former smoker,          Cardiovascular     Neuro/Psych Anxiety Depression    GI/Hepatic GERD-  ,  Endo/Other    Renal/GU      Musculoskeletal   Abdominal   Peds  Hematology   Anesthesia Other Findings   Reproductive/Obstetrics                          Anesthesia Physical Anesthesia Plan  ASA: I  Anesthesia Plan: General   Post-op Pain Management:    Induction: Intravenous  Airway Management Planned: LMA and Oral ETT  Additional Equipment:   Intra-op Plan:   Post-operative Plan: Extubation in OR  Informed Consent: I have reviewed the patients History and Physical, chart, labs and discussed the procedure including the risks, benefits and alternatives for the proposed anesthesia with the patient or authorized representative who has indicated his/her understanding and acceptance.     Plan Discussed with:   Anesthesia Plan Comments:         Anesthesia Quick Evaluation

## 2013-02-27 NOTE — Anesthesia Postprocedure Evaluation (Signed)
  Anesthesia Post-op Note  Patient: Courtney PillingNancy Morton  Procedure(s) Performed: Procedure(s): TOTAL KNEE ARTHROPLASTY (Left)  Patient Location: PACU  Anesthesia Type:GA combined with regional for post-op pain  Level of Consciousness: awake, alert , sedated and patient cooperative  Airway and Oxygen Therapy: Patient Spontanous Breathing  Post-op Pain: mild  Post-op Assessment: Post-op Vital signs reviewed, Patient's Cardiovascular Status Stable, Respiratory Function Stable, Patent Airway, No signs of Nausea or vomiting and Pain level controlled  Post-op Vital Signs: stable  Complications: No apparent anesthesia complications

## 2013-02-28 ENCOUNTER — Other Ambulatory Visit: Payer: Self-pay | Admitting: Orthopedic Surgery

## 2013-02-28 MED ORDER — OXYCODONE HCL ER 10 MG PO T12A: 10.0000 mg | EXTENDED_RELEASE_TABLET | Freq: Two times a day (BID) | ORAL | Status: DC

## 2013-02-28 MED ORDER — OXYCODONE HCL 5 MG PO TABS: 5.0000 mg | ORAL_TABLET | ORAL | Status: DC | PRN

## 2013-02-28 MED ORDER — METHOCARBAMOL 500 MG PO TABS: 500.0000 mg | ORAL_TABLET | Freq: Four times a day (QID) | ORAL | Status: DC | PRN

## 2013-02-28 MED ORDER — ENOXAPARIN SODIUM 40 MG/0.4ML ~~LOC~~ SOLN: 40.0000 mg | SUBCUTANEOUS | Status: DC

## 2013-02-28 NOTE — Op Note (Signed)
TOTAL KNEE REPLACEMENT OPERATIVE NOTE:  02/27/2013  9:13 AM  PATIENT:  Courtney Morton Age  53 y.o. female  PRE-OPERATIVE DIAGNOSIS:  osteoarthritis left knee  POST-OPERATIVE DIAGNOSIS:  osteoarthritis left knee  PROCEDURE:  Procedure(s): TOTAL KNEE ARTHROPLASTY  SURGEON:  Surgeon(s): Dannielle HuhSteve Analynn Daum, MD  PHYSICIAN ASSISTANT: Altamese CabalMaurice Jones, [facility]  ANESTHESIA:   general  DRAINS: Hemovac  SPECIMEN: None  COUNTS:  Correct  TOURNIQUET:   Total Tourniquet Time Documented: Thigh (Left) - 44 minutes Total: Thigh (Left) - 44 minutes   DICTATION:  Indication for procedure:    The patient is a 53 y.o. female who has failed conservative treatment for osteoarthritis left knee.  Informed consent was obtained prior to anesthesia. The risks versus benefits of the operation were explain and in a way the patient can, and did, understand.   On the implant demand matching protocol, this patient scored 16.  Therefore, this patient was receive a polyethylene insert with vitamin E which is a high demand implant.  Description of procedure:     The patient was taken to the operating room and placed under anesthesia.  The patient was positioned in the usual fashion taking care that all body parts were adequately padded and/or protected.  I foley catheter was not placed.  A tourniquet was applied and the leg prepped and draped in the usual sterile fashion.  The extremity was exsanguinated with the esmarch and tourniquet inflated to 350 mmHg.  Pre-operative range of motion was normal.  The knee was in 5 degree of mild varus.  A midline incision approximately 6-7 inches long was made with a #10 blade.  A new blade was used to make a parapatellar arthrotomy going 2-3 cm into the quadriceps tendon, over the patella, and alongside the medial aspect of the patellar tendon.  A synovectomy was then performed with the #10 blade and forceps. I then elevated the deep MCL off the medial tibial metaphysis subperiosteally  around to the semimembranosus attachment.    I everted the patella and used calipers to measure patellar thickness.  I used the reamer to ream down to appropriate thickness to recreate the native thickness.  I then removed excess bone with the rongeur and sagittal saw.  I used the appropriately sized template and drilled the three lug holes.  I then put the trial in place and measured the thickness with the calipers to ensure recreation of the native thickness.  The trial was then removed and the patella subluxed and the knee brought into flexion.  A homan retractor was place to retract and protect the patella and lateral structures.  A Z-retractor was place medially to protect the medial structures.  The extra-medullary alignment system was used to make cut the tibial articular surface perpendicular to the anamotic axis of the tibia and in 3 degrees of posterior slope.  The cut surface and alignment jig was removed.  I then used the intramedullary alignment guide to make a 6 valgus cut on the distal femur.  I then marked out the epicondylar axis on the distal femur.  The posterior condylar axis measured 3 degrees.  I then used the anterior referencing sizer and measured the femur to be a size 7.  The 4-In-1 cutting block was screwed into place in external rotation matching the posterior condylar angle, making our cuts perpendicular to the epicondylar axis.  Anterior, posterior and chamfer cuts were made with the sagittal saw.  The cutting block and cut pieces were removed.  A lamina spreader  was placed in 90 degrees of flexion.  The ACL, PCL, menisci, and posterior condylar osteophytes were removed.  A 10 mm spacer blocked was found to offer good flexion and extension gap balance after minimal in degree releasing.   The scoop retractor was then placed and the femoral finishing block was pinned in place.  The small sagittal saw was used as well as the lug drill to finish the femur.  The block and cut  surfaces were removed and the medullary canal hole filled with autograft bone from the cut pieces.  The tibia was delivered forward in deep flexion and external rotation.  A size D tray was selected and pinned into place centered on the medial 1/3 of the tibial tubercle.  The reamer and keel was used to prepare the tibia through the tray.    I then trialed with the size 7 femur, size D tibia, a 10 mm insert and the 32 patella.  I had excellent flexion/extension gap balance, excellent patella tracking.  Flexion was full and beyond 120 degrees; extension was zero.  These components were chosen and the staff opened them to me on the back table while the knee was lavaged copiously and the cement mixed.  The soft tissue was infiltrated with 60cc of exparel 1.3% through a 21 gauge needle.  I cemented in the components and removed all excess cement.  The polyethylene tibial component was snapped into place and the knee placed in extension while cement was hardening.  The capsule was infilltrated with 30cc of .25% Marcaine with epinephrine.  A hemovac was place in the joint exiting superolaterally.  A pain pump was place superomedially superficial to the arthrotomy.  Once the cement was hard, the tourniquet was let down.  Hemostasis was obtained.  The arthrotomy was closed with figure-8 #1 vicryl sutures.  The deep soft tissues were closed with #0 vicryls and the subcuticular layer closed with a running #2-0 vicryl.  The skin was reapproximated and closed with skin staples.  The wound was dressed with xeroform, 4 x4's, 2 ABD sponges, a single layer of webril and a TED stocking.   The patient was then awakened, extubated, and taken to the recovery room in stable condition.  BLOOD LOSS:  300cc DRAINS: 1 hemovac, 1 pain catheter COMPLICATIONS:  None.  PLAN OF CARE: Admit to inpatient   PATIENT DISPOSITION:  PACU - hemodynamically stable.   Delay start of Pharmacological VTE agent (>24hrs) due to surgical  blood loss or risk of bleeding:  not applicable  Please fax a copy of this op note to my office at [phone] (please only include page 1 and 2 of the Case Information op note)

## 2013-02-28 NOTE — Care Management Note (Signed)
CARE MANAGEMENT NOTE 02/28/2013  Patient:  Deon PillingSANDERS,Jamekia   Account Number:  [id number]  Date Initiated:  02/28/2013  Documentation initiated by:  Vance PeperBRADY,Able Malloy  Subjective/Objective Assessment:   53 yr old female s/p left total knee arthroplasty.     Action/Plan:   Case Manager spoke with Patient. [facility]. [facility] [phone]. DME has been delivered to the home.   Anticipated DC Date:  03/01/2013   Anticipated DC Plan:  HOME W HOME HEALTH SERVICES      DC Planning Services  CM consult      [facility]  HOME HEALTH  DURABLE MEDICAL EQUIPMENT   Choice offered to / List presented to:  C-1 Patient      DME agency  [employer]     HH arranged  HH-2 PT      [facility]  [facility]   Status of service:  Completed, signed off Medicare Important Message given?   (If response is "NO", the following Medicare IM given date fields will be blank) Date Medicare IM given:   Date Additional Medicare IM given:    Discharge Disposition:  HOME W HOME HEALTH SERVICES

## 2013-02-28 NOTE — Progress Notes (Signed)
SPORTS MEDICINE AND JOINT REPLACEMENT  Courtney SpurlingStephen Lucey, MD   Altamese CabalMaurice Tung Pustejovsky, PA-C [address], [place], [place]  [address]                             [phone]   PROGRESS NOTE  Subjective:  negative for Chest Pain  negative for Shortness of Breath  negative for Nausea/Vomiting   negative for Calf Pain  negative for Bowel Movement   Tolerating Diet: yes         Patient reports pain as 5 on 0-10 scale.    Objective: Vital signs in last 24 hours:   Patient Vitals for the past 24 hrs:  BP Temp Pulse Resp SpO2  02/28/13 0800 - - - 18 98 %  02/28/13 0538 110/63 mmHg 99.1 F (37.3 C) 76 18 98 %  02/27/13 2059 109/53 mmHg 98.8 F (37.1 C) 75 18 99 %  02/27/13 1600 - - - 18 -  02/27/13 1242 110/53 mmHg 98.8 F (37.1 C) 74 18 100 %  02/27/13 1200 - - - 18 -  02/27/13 1050 125/66 mmHg 98 F (36.7 C) 66 18 98 %    @flow {1959:LAST@   Intake/Output from previous day:   02/16 0701 - 02/17 0700 In: 2340 [P.O.:480; I.V.:1750] Out: 1625 [Urine:925; Drains:650]   Intake/Output this shift:   02/17 0701 - 02/17 1900 In: 720 [P.O.:720] Out: -    Intake/Output     02/16 0701 - 02/17 0700 02/17 0701 - 02/18 0700   P.O. 480 720   I.V. 1750    IV Piggyback 110    Total Intake 2340 720   Urine 925    Drains 650    Blood 50    Total Output 1625     Net +715 +720        Urine Occurrence 3 x 2 x      LABORATORY DATA:  Recent Labs  02/22/13 0917 02/27/13 1245  WBC 6.8 11.2*  HGB 12.7 11.0*  HCT 36.7 32.8*  PLT 300 239    Recent Labs  02/22/13 0917 02/27/13 1245  NA 143  --   K 4.5  --   CL 104  --   CO2 26  --   BUN 11  --   CREATININE 0.54 0.66  GLUCOSE 82  --   CALCIUM 8.9  --    Lab Results  Component Value Date   INR 0.97 02/22/2013    Examination:  General appearance: alert, cooperative and no distress Extremities: extremities normal, atraumatic, no cyanosis or edema and Homans sign is negative, no sign of DVT  Wound Exam: clean, dry,  intact   Drainage:  Scant/small amount Serosanguinous exudate  Motor Exam: EHL and FHL Intact  Sensory Exam: Deep Peroneal normal   Assessment:    1 Day Post-Op  Procedure(s) (LRB): TOTAL KNEE ARTHROPLASTY (Left)  ADDITIONAL DIAGNOSIS:  Active Problems:   S/P TKR (total knee replacement)  Acute Blood Loss Anemia   Plan: Physical Therapy as ordered Weight Bearing as Tolerated (WBAT)  DVT Prophylaxis:  Lovenox  DISCHARGE PLAN: Home  DISCHARGE NEEDS: HHPT, CPM, Walker and 3-in-1 comode seat         Jeanclaude Wentworth 02/28/2013, 10:25 AM

## 2013-02-28 NOTE — Evaluation (Signed)
Occupational Therapy Evaluation and Discharge Patient Details Name: Pamila Mendibles MRN: [id number] DOB: 07-06-1960 Today's Date: 02/28/2013 Time: 4098-1191 OT Time Calculation (min): 31 min  OT Assessment / Plan / Recommendation History of present illness 53 y.o. female admitted to [facility] on 02/27/13 for elective L TKA.     Clinical Impression   This 53 yo female admitted and underwent above presents to acute OT with all education completed. Acute OT will sign off.    OT Assessment  Patient does not need any further OT services    Follow Up Recommendations  No OT follow up       Equipment Recommendations  None recommended by OT          Precautions / Restrictions Precautions Precautions: Knee Precaution Comments: reviewed proper positioning Restrictions Weight Bearing Restrictions: Yes LLE Weight Bearing: Weight bearing as tolerated   Pertinent Vitals/Pain 7/10 pain in left knee; not time for pain meds at time of session    ADL  Eating/Feeding: Independent Where Assessed - Eating/Feeding: Chair Grooming: Supervision/safety Where Assessed - Grooming: Unsupported standing Upper Body Bathing: Set up Where Assessed - Upper Body Bathing: Unsupported sitting Lower Body Bathing: Minimal assistance Where Assessed - Lower Body Bathing: Unsupported sit to stand Upper Body Dressing: Set up Where Assessed - Upper Body Dressing: Unsupported sitting Lower Body Dressing: Moderate assistance Where Assessed - Lower Body Dressing: Unsupported sit to stand Toilet Transfer: Min Pension scheme manager Method: Sit to Barista: Raised toilet seat with arms (or 3-in-1 over toilet) Toileting - Clothing Manipulation and Hygiene: Min guard Where Assessed - Engineer, mining and Hygiene: Standing Tub/Shower Transfer: Insurance risk surveyor Method: Science writer: Counsellor Used: Gait belt;Rolling  walker Transfers/Ambulation Related to ADLs: min guard A for all with RW ADL Comments: Family will A with LBADLs until she can do them by herself     Acute Rehab OT Goals Patient Stated Goal: home tomorrow  Visit Information  Last OT Received On: 02/28/13 Assistance Needed: +1 History of Present Illness: 53 y.o. female admitted to [facility] on 02/27/13 for elective L TKA.         Prior Functioning     Home Living Family/patient expects to be discharged to:: Private residence Living Arrangements: Spouse/significant other Available Help at Discharge: Family;Available PRN/intermittently Type of Home: House Home Access: Stairs to enter [employer] of Steps: 1 Entrance Stairs-Rails: None Home Layout: Two level;Able to live on main level with bedroom/bathroom Home Equipment: Dan Humphreys - 2 wheels;Bedside commode;Tub bench Additional Comments: When pt's husband at work, her brother will be there working on their basement and can help her as needed Prior Function Level of Independence: Independent Communication Communication: No difficulties Dominant Hand: Right         Vision/Perception Vision - History Patient Visual Report: No change from baseline   Cognition  Cognition Arousal/Alertness: Awake/alert Behavior During Therapy: WFL for tasks assessed/performed Overall Cognitive Status: Within Functional Limits for tasks assessed    Extremity/Trunk Assessment Upper Extremity Assessment Upper Extremity Assessment: Overall WFL for tasks assessed     Mobility Bed Mobility Overal bed mobility: Needs Assistance Bed Mobility: Supine to Sit Supine to sit: Min assist;HOB elevated (LLE) Transfers Overall transfer level: Needs assistance Equipment used: Rolling walker (2 wheeled) Transfers: Sit to/from Stand Sit to Stand: Min guard           End of Session OT - End of Session Equipment Utilized During Treatment: Gait belt;Rolling walker Activity  Tolerance: Patient  tolerated treatment well Patient left: in chair;with call bell/phone within reach Nurse Communication: Patient requests pain meds CPM Left Knee CPM Left Knee: Off Left Knee Flexion (Degrees): [address]       Evette GeorgesLeonard, Sultan Pargas Eva [phone] 02/28/2013, 11:44 AM

## 2013-02-28 NOTE — Progress Notes (Signed)
Orthopedic Tech Progress Note Patient Details:  Courtney Morton 06/30/1960 [id number] On cpm at 8:30 pm RLE 0-60 Patient ID: Courtney Morton, female   DOB: 01/12/1960, 53 y.o.   MRN: [id number]   [name], Courtney Morton 02/28/2013, 8:31 PM

## 2013-02-28 NOTE — Progress Notes (Signed)
Physical Therapy Treatment Patient Details Name: Courtney Morton MRN: [id number] DOB: 08/16/1960 Today's Date: 02/28/2013 Time: 4098-11911525-1558 PT Time Calculation (min): 33 min  PT Assessment / Plan / Recommendation  History of Present Illness 53 [facility].o. [facility] for [facility] L [facility]KA.     PT Comments   Pt is limited by pain tolerance, but progressing well with gait.  Will practice stairs before planned d/c home in AM.  HHPT at discharge recommended.    Follow Up Recommendations  Home health PT;Supervision - Intermittent     Does the patient have the potential to tolerate intense rehabilitation    NA  Barriers to Discharge   None      Equipment Recommendations  None recommended by PT    Recommendations for Other Services   None  Frequency 7X/week   Progress towards PT Goals Progress towards PT goals: Progressing toward goals  Plan Current plan remains appropriate    Precautions / Restrictions Precautions Precautions: Knee Precaution Booklet Issued: Yes (comment) Precaution Comments: exercise packet given and reviewed, knee precaution (no pillow) reviewed Restrictions LLE Weight Bearing: Weight bearing as tolerated   Pertinent Vitals/Pain 7/10 pain before walking in left knee, just was pre medicated.  Ice applied after session.  Pt did not want to get in CPM without a "pain shot" so CPM not applied.  RN made aware.    Mobility  Bed Mobility Overal bed mobility: Needs Assistance Bed Mobility: Supine to Sit Sidelying to sit: Min assist Supine to sit: Min assist General bed mobility comments: min assist to help progress left leg into and out of bed.  Transfers Overall transfer level: Needs assistance Equipment used: Rolling walker (2 wheeled) Transfers: Sit to/from Stand Sit to Stand: Min guard General transfer comment: min guard assist for safety Ambulation/Gait Ambulation/Gait assistance: Min guard Ambulation Distance (Feet): 95 Feet Assistive device:  Rolling walker (2 wheeled) Gait Pattern/deviations: Step-through pattern;Ataxic Gait velocity: decreased Gait velocity interpretation: Below normal speed for age/gender General Gait Details: pt putting slightly more weight through her left foot this afternoon during stance phase.  Knee still very flexed and pt putting most of her weight on her arms and right leg.   Stairs: Yes General stair comments: Verbally reviewed correct leg sequence.      Exercises Total Joint Exercises Ankle Circles/Pumps: AROM;Both;20 reps;Supine Quad Sets: AROM;Both;10 reps;Supine Towel Squeeze: AROM;Both;10 reps;Supine Short Arc Quad: AAROM;Left;10 reps;Supine Heel Slides: AROM;Left;10 reps;Supine Hip ABduction/ADduction: Left;AAROM;10 reps;Supine Straight Leg Raises: AAROM;Left;10 reps;Supine Long Arc Quad: AAROM;10 reps;Seated Knee Flexion: AROM;Left;10 reps;Seated    PT Goals (current goals can now be found in the care plan section) Acute Rehab PT Goals Patient Stated Goal: home tomorrow  Visit Information  Last PT Received On: 02/28/13 Assistance Needed: +1 History of Present Illness: 53 [facility]. [facility] L [facility]KA.      Subjective Data  Subjective: Pt reports that she is feeling a little better and had pain meds ~30 mins before our session.   Patient Stated Goal: home tomorrow   Cognition  Cognition Arousal/Alertness: Awake/alert Behavior During Therapy: WFL for tasks assessed/performed Overall Cognitive Status: Within Functional Limits for tasks assessed    Balance  Balance Overall balance assessment: Needs assistance Standing balance support: Bilateral upper extremity supported Standing balance-Leahy Scale: Fair  End of Session PT - End of Session Activity Tolerance: Patient limited by pain Patient left: in bed;with call bell/phone within reach Nurse Communication: Mobility status    Lurena JoinerRebecca  Melissa Montane, PT, DPT [phone]   02/28/2013, 4:21 PM

## 2013-02-28 NOTE — Discharge Instructions (Signed)
Diet: As you were doing prior to hospitalization   Activity:  Increase activity slowly as tolerated                  No lifting or driving for 6 weeks  Shower:  May shower without a dressing once there is no drainage from your wound.                 Do NOT wash over the wound.                 Dressing:  You may change your dressing on Thursday                    Then change the dressing daily with sterile 4"x4"s gauze dressing                     And TED hose for knees.  Weight Bearing:  Weight bearing as tolerated as taught in physical therapy.  Use a                                walker or Crutches as instructed.  To prevent constipation: you may use a stool softener such as -               Colace ( over the counter) 100 mg by mouth twice a day                Drink plenty of fluids ( prune juice may be helpful) and high fiber foods                Miralax ( over the counter) for constipation as needed.    Precautions:  If you experience chest pain or shortness of breath - call 911 immediately               For transfer to the hospital emergency department!!               If you develop a fever greater that 101 F, purulent drainage from wound,                             increased redness or drainage from wound, or calf pain -- Call the office.  Follow- Up Appointment:  Please call for an appointment to be seen on 03/14/13                                              [facility] office:  [phone]            [address] [place], [place] [address]                 [facility] Health physical [facility] [phone]

## 2013-02-28 NOTE — Progress Notes (Signed)
Physical Therapy Treatment Patient Details Name: Courtney Morton MRN: [id number] DOB: 09/29/1960 Today's Date: 02/28/2013 Time: [id number] PT Time Calculation (min): 12 min  PT Assessment / Plan / Recommendation  History of Present Illness 53 y.o. [facility] L [facility]KA.     PT Comments   Pt is POD #1 and is moving well despite reports of knee pain with mobility.  She was able to progress gait with RW today and needed less overall assist.  She continues to be appropriate for HHPT at discharge and already owns a RW and 3-in-1 BSC.    Follow Up Recommendations  Home health PT;Supervision - Intermittent     Does the patient have the potential to tolerate intense rehabilitation    NA  Barriers to Discharge   None      Equipment Recommendations  None recommended by PT    Recommendations for Other Services   None  Frequency 7X/week   Progress towards PT Goals Progress towards PT goals: Progressing toward goals  Plan Current plan remains appropriate    Precautions / Restrictions Precautions Precautions: Knee Precaution Comments: reviewed proper positioning Restrictions Weight Bearing Restrictions: Yes LLE Weight Bearing: Weight bearing as tolerated   Pertinent Vitals/Pain 9/10 L knee pain, RN brought po and IV pain meds during our session. Ice applied and leg elevated after session.      Mobility  Transfers Overall transfer level: Needs assistance Equipment used: Rolling walker (2 wheeled) Transfers: Sit to/from Stand Sit to Stand: Min guard General transfer comment: min guard assist for safety during transitions, verbal cues for leg placement and safe hand placement.  Ambulation/Gait Ambulation/Gait assistance: Min guard Ambulation Distance (Feet): 80 Feet Assistive device: Rolling walker (2 wheeled) Gait Pattern/deviations: Step-to pattern;Antalgic;Decreased weight shift to right Gait velocity: decreased Gait velocity interpretation: Below normal  speed for age/gender General Gait Details: verbal cues for WB status and correct leg sequencing.      Exercises Total Joint Exercises Ankle Circles/Pumps: AROM;Both;20 reps;Supine Quad Sets: AROM;Both;10 reps;Supine Heel Slides: AROM;Left;10 reps;Supine    PT Goals (current goals can now be found in the care plan section) Acute Rehab PT Goals Patient Stated Goal: return home  Visit Information  Last PT Received On: 02/28/13 Assistance Needed: +1 History of Present Illness: 53 y.o. [facility] for elective L TKA.      Subjective Data  Subjective: Pt reports she did not sleep very well last night due to pain.   Patient Stated Goal: return home   Cognition  Cognition Arousal/Alertness: Awake/alert Behavior During Therapy: WFL for tasks assessed/performed Overall Cognitive Status: Within Functional Limits for tasks assessed    Balance  Balance Overall balance assessment: Needs assistance Standing balance support: Bilateral upper extremity supported Standing balance-Leahy Scale: Fair  End of Session PT - End of Session Activity Tolerance: Patient limited by pain Patient left: in chair;with call bell/phone within reach CPM Left Knee CPM Left Knee: Off Left Knee Flexion (Degrees): 60     Reola Buckles B. Yussef Jorge, PT, DPT [phone]   02/28/2013, 10:38 AM

## 2013-03-01 NOTE — Progress Notes (Signed)
Physical Therapy Treatment Patient Details Name: Courtney Morton MRN: [id number] DOB: 05/26/1960 Today's Date: 03/01/2013 Time: [id number] PT Time Calculation (min): 29 min  PT Assessment / Plan / Recommendation  History of Present Illness 53 y.o. female [facility] to [facility]/13 for elective L TKA.     PT Comments   Pt progressing well towards physical therapy goals. Continues to require some assist with functional mobility. Pt in increased pain this morning and was requesting pain medication prior to d/c due to the long car ride home. Pt and husband do not have any questions for PT and state they feel comfortable returning home at this time.   Follow Up Recommendations  Home health PT;Supervision - Intermittent     Does the patient have the potential to tolerate intense rehabilitation     Barriers to Discharge        Equipment Recommendations  None recommended by PT    Recommendations for Other Services    Frequency 7X/week   Progress towards PT Goals Progress towards PT goals: Progressing toward goals  Plan Current plan remains appropriate    Precautions / Restrictions Precautions Precautions: Knee;Fall Restrictions Weight Bearing Restrictions: Yes LLE Weight Bearing: Weight bearing as tolerated   Pertinent Vitals/Pain 5/10 at rest. 8/10 pain after ambulation and stair training.     Mobility  Bed Mobility Overal bed mobility: Needs Assistance Bed Mobility: Supine to Sit Supine to sit: Min assist General bed mobility comments: Assist for movement and support of LLE off bed as pt transitions to full sitting with feet planted on floor.  Transfers Overall transfer level: Needs assistance Equipment used: Rolling walker (2 wheeled) Transfers: Sit to/from Stand Sit to Stand: Min guard General transfer comment: Pt demonstrates proper hand placement. Guarding for safety.  Ambulation/Gait Ambulation/Gait assistance: Min guard Ambulation Distance (Feet): 100 Feet Assistive  device: Rolling walker (2 wheeled) Gait Pattern/deviations: Step-to pattern;Step-through pattern;Decreased stride length Gait velocity: decreased Gait velocity interpretation: Below normal speed for age/gender General Gait Details: VC's for sequencing and increased quad activation for safety. Pt moving very slowly this morning.  Stairs: Yes Stairs assistance: Min guard Stair Management: No rails;Forwards;With walker Number of Stairs: 1 General stair comments: VC's for sequencing and safety awareness.     Exercises     PT Diagnosis:    PT Problem List:   PT Treatment Interventions:     PT Goals (current goals can now be found in the care plan section) Acute Rehab PT Goals Patient Stated Goal: Home this morning PT Goal Formulation: With patient Time For Goal Achievement: 03/06/13 Potential to Achieve Goals: Good  Visit Information  Last PT Received On: 03/01/13 Assistance Needed: +1 History of Present Illness: 53 y.o. female [facility]27/13 for elective L TKA.      Subjective Data  Subjective: "I overdid it yesterday with PT. I am so sore." Patient Stated Goal: Home this morning   Cognition  Cognition Arousal/Alertness: Awake/alert Behavior During Therapy: WFL for tasks assessed/performed Overall Cognitive Status: Within Functional Limits for tasks assessed    Balance  Balance Overall balance assessment: Needs assistance Sitting-balance support: Feet supported;Bilateral upper extremity supported Sitting balance-Leahy Scale: Fair Standing balance support: Bilateral upper extremity supported Standing balance-Leahy Scale: Fair General Comments General comments (skin integrity, edema, etc.): Reviewed HEP, pt states she has no questions or concerns  End of Session PT - End of Session Equipment Utilized During Treatment: Gait belt Activity Tolerance: Patient limited by pain Patient left: in bed;with call bell/phone  within reach Nurse Communication: Mobility  status   GP     Ruthann Cancer 03/01/2013, 2:17 PM  Ruthann Cancer, PT, DPT [phone]

## 2013-03-01 NOTE — Discharge Summary (Signed)
SPORTS MEDICINE & JOINT REPLACEMENT   Georgena Spurling, MD   Altamese Cabal, PA-C [address] [place], [place], [place]  [address]                             [phone]  PATIENT ID: Courtney Morton        MRN:  [id number]          DOB/AGE: 1960-12-19 / 53 y.o.    DISCHARGE SUMMARY  ADMISSION DATE:    02/27/2013 DISCHARGE DATE:   03/01/2013   ADMISSION DIAGNOSIS: osteoarthritis left knee    DISCHARGE DIAGNOSIS:  osteoarthritis left knee    ADDITIONAL DIAGNOSIS: Active Problems:   S/P TKR (total knee replacement)  Past Medical History  Diagnosis Date  . Anxiety   . Insomnia     Hx: of  . Depression   . GERD (gastroesophageal reflux disease)   . Hypothyroidism     "not on RX anymore" (02/27/2013)  . H/O hiatal hernia   . OA (osteoarthritis)     PROCEDURE: Procedure(s): TOTAL KNEE ARTHROPLASTY on 02/27/2013  CONSULTS:     HISTORY:  See H&P in chart  HOSPITAL COURSE:  Courtney Morton is a 53 y.o. admitted on 02/27/2013 and found to have a diagnosis of osteoarthritis left knee.  After appropriate laboratory studies were obtained  they were taken to the operating room on 02/27/2013 and underwent Procedure(s): TOTAL KNEE ARTHROPLASTY.   They were given perioperative antibiotics:  Anti-infectives   Start     Dose/Rate Route Frequency Ordered Stop   02/27/13 1400  ceFAZolin (ANCEF) IVPB 1 g/50 mL premix     1 g 100 mL/hr over 30 Minutes Intravenous Every 6 hours 02/27/13 1058 02/28/13 0008   02/27/13 0600  ceFAZolin (ANCEF) IVPB 2 g/50 mL premix  Status:  Discontinued     2 g 100 mL/hr over 30 Minutes Intravenous On call to O.R. 02/26/13 1439 02/27/13 1030    .  Tolerated the procedure well.  Placed with a foley intraoperatively.  Given Ofirmev at induction and for 48 hours.    POD# 1: Vital signs were stable.  Patient denied Chest pain, shortness of breath, or calf pain.  Patient was started on Lovenox 30 mg subcutaneously twice daily at 8am.  Consults to PT, OT, and care  management were made.  The patient was weight bearing as tolerated.  CPM was placed on the operative leg 0-90 degrees for 6-8 hours a day.  Incentive spirometry was taught.  Dressing was changed.  Marcaine pump and hemovac were discontinued.      POD #2, Continued  PT for ambulation and exercise program.  IV saline locked.  O2 discontinued.    The remainder of the hospital course was dedicated to ambulation and strengthening.   The patient was discharged on 2 Days Post-Op in  Good condition.  Blood products given:none  DIAGNOSTIC STUDIES: Recent vital signs: Patient Vitals for the past 24 hrs:  BP Temp Temp src Pulse Resp SpO2  03/01/13 0625 104/57 mmHg 99.2 F (37.3 C) Oral 82 16 99 %  03/01/13 0400 - - - - 16 -  02/28/13 2023 98/41 mmHg 98.4 F (36.9 C) Oral 71 18 99 %  02/28/13 1300 112/61 mmHg 98.1 F (36.7 C) - 86 18 98 %       Recent laboratory studies:  Recent Labs  02/27/13 1245  WBC 11.2*  HGB 11.0*  HCT 32.8*  PLT 239  Recent Labs  02/27/13 1245  CREATININE 0.66   Lab Results  Component Value Date   INR 0.97 02/22/2013     Recent Radiographic Studies :  Dg Chest 2 View  02/22/2013   CLINICAL DATA:  Preop for left knee arthroplasty  EXAM: CHEST  2 VIEW  COMPARISON:  None.  FINDINGS: Cardiomediastinal silhouette is unremarkable. No acute infiltrate or pleural effusion. No pulmonary edema. Small hiatal hernia. Degenerative changes mid thoracic spine.  IMPRESSION: No active cardiopulmonary disease.   Electronically Signed   By: Natasha Mead M.D.   On: 02/22/2013 09:33    DISCHARGE INSTRUCTIONS: Discharge Orders   Future Orders Complete By Expires   Call MD / Call 911  As directed    Comments:     If you experience chest pain or shortness of breath, CALL 911 and be transported to the hospital emergency room.  If you develope a fever above 101 F, pus (white drainage) or increased drainage or redness at the wound, or calf pain, call your surgeon's office.    Change dressing  As directed    Comments:     Change dressing on thursday, then change the dressing daily with sterile 4 x 4 inch gauze dressing and apply TED hose.   Constipation Prevention  As directed    Comments:     Drink plenty of fluids.  Prune juice may be helpful.  You may use a stool softener, such as Colace (over the counter) 100 mg twice a day.  Use MiraLax (over the counter) for constipation as needed.   CPM  As directed    Comments:     Continuous passive motion machine (CPM):      Use the CPM from 0 to 90 for 6-8 hours per day.      You may increase by 10 per day.  You may break it up into 2 or 3 sessions per day.      Use CPM for 2 weeks or until you are told to stop.   Diet - low sodium heart healthy  As directed    Do not put a pillow under the knee. Place it under the heel.  As directed    Driving restrictions  As directed    Comments:     No driving for 6 weeks   Increase activity slowly as tolerated  As directed    Lifting restrictions  As directed    Comments:     No lifting for 6 weeks   TED hose  As directed    Comments:     Use stockings (TED hose) for 3 weeks on both leg(s).  You may remove them at night for sleeping.      DISCHARGE MEDICATIONS:     Medication List    STOP taking these medications       HYDROcodone-acetaminophen 5-325 MG per tablet  Commonly known as:  NORCO/VICODIN      TAKE these medications       ALPRAZolam 0.25 MG tablet  Commonly known as:  XANAX  Take 0.25 mg by mouth at bedtime as needed for anxiety.     enoxaparin 40 MG/0.4ML injection  Commonly known as:  LOVENOX  Inject 0.4 mLs (40 mg total) into the skin daily.     escitalopram 20 MG tablet  Commonly known as:  LEXAPRO  Take 20 mg by mouth daily.     estradiol 0.1 MG/24HR patch  Commonly known as:  VIVELLE-DOT  Place 1 patch  onto the skin 2 (two) times a week.     methocarbamol 500 MG tablet  Commonly known as:  ROBAXIN  Take 1-2 tablets (500-1,000 mg  total) by mouth every 6 (six) hours as needed for muscle spasms.     multivitamin with minerals tablet  Take 1 tablet by mouth daily.     omeprazole 20 MG capsule  Commonly known as:  PRILOSEC  Take 20 mg by mouth daily.     oxyCODONE 5 MG immediate release tablet  Commonly known as:  Oxy IR/ROXICODONE  Take 1-2 tablets (5-10 mg total) by mouth every 3 (three) hours as needed for breakthrough pain.     OxyCODONE 10 mg T12a 12 hr tablet  Commonly known as:  OXYCONTIN  Take 1 tablet (10 mg total) by mouth every 12 (twelve) hours.        FOLLOW UP VISIT:       Follow-up Information   Follow up with Raymon MuttonLUCEY,STEPHEN D, MD. Call on 03/14/2013.   Specialty:  Orthopedic Surgery   Contact information:   [address] [place] [address] [phone]       DISPOSITION: HOME   CONDITION:  Good   Courtney Morton 03/01/2013, 12:47 PM

## 2013-03-03 ENCOUNTER — Encounter (HOSPITAL_COMMUNITY): Payer: Self-pay | Admitting: Orthopedic Surgery

## 2013-03-20 ENCOUNTER — Encounter (HOSPITAL_COMMUNITY): Payer: Self-pay | Admitting: Orthopedic Surgery

## 2013-03-20 NOTE — OR Nursing (Signed)
Late entry on 03-20-2013 by Timoteo Expose. Faust Thorington, RN to add surgical procedure end time.

## 2013-11-28 ENCOUNTER — Other Ambulatory Visit: Payer: Self-pay | Admitting: Orthopedic Surgery

## 2013-12-29 ENCOUNTER — Inpatient Hospital Stay (HOSPITAL_COMMUNITY)
Admission: RE | Admit: 2013-12-29 | Discharge: 2013-12-29 | Disposition: A | Discharge status: Home / Self Care | Payer: BC Managed Care – PPO | Source: Ambulatory Visit

## 2013-12-29 NOTE — Pre-Procedure Instructions (Signed)
Courtney Morton  12/29/2013   Your procedure is scheduled on: Monday, January 08, 2014  [facility] at 5:30 AM.  Call this number if you have problems the morning of surgery: [phone]   Remember:   Do not eat food or drink liquids after midnight Sunday, January 07, 2014   Take these medicines the morning of surgery with A SIP OF WATER:escitalopram (LEXAPRO),   If needed: oxyCODONE  for pain  Stop taking Aspirin, vitamins, and herbal medications. Do not take any NSAIDs ie: Ibuprofen, Advil, Naproxen or any medication containing Aspirin;stop 5 days prior to procedure (Tuesday, January 02, 2014).  Do not wear jewelry, make-up or nail polish.  Do not wear lotions, powders, or perfumes. You may not wear deodorant.  Do not shave 48 hours prior to surgery.   Do not bring valuables to the hospital.  [facility] is not responsible for any belongings or valuables.               Contacts, dentures or bridgework may not be worn into surgery.  Leave suitcase in the car. After surgery it may be brought to your room.  For patients admitted to the hospital, discharge time is determined by your treatment team.               Patients discharged the day of surgery will not be allowed to drive home.  Name and phone number of your driver:   Special Instructions:  Special Instructions:Special Instructions:  - Preparing for Surgery  Before surgery, you can play an important role.  Because skin is not sterile, your skin needs to be as free of germs as possible.  You can reduce the number of germs on you skin by washing with CHG (chlorahexidine gluconate) soap before surgery.  CHG is an antiseptic cleaner which kills germs and bonds with the skin to continue killing germs even after washing.  Please DO NOT use if you have an allergy to CHG or antibacterial soaps.  If your skin becomes reddened/irritated stop using the CHG and inform your nurse when you arrive at  Short Stay.  Do not shave (including legs and underarms) for at least 48 hours prior to the first CHG shower.  You may shave your face.  Please follow these instructions carefully:   1.  Shower with CHG Soap the night before surgery and the morning of Surgery.  2.  If you choose to wash your hair, wash your hair first as usual with your normal shampoo.  3.  After you shampoo, rinse your hair and body thoroughly to remove the Shampoo.  4.  Use CHG as you would any other liquid soap.  You can apply chg directly  to the skin and wash gently with scrungie or a clean washcloth.  5.  Apply the CHG Soap to your body ONLY FROM THE NECK DOWN.  Do not use on open wounds or open sores.  Avoid contact with your eyes, ears, mouth and genitals (private parts).  Wash genitals (private parts) with your normal soap.  6.  Wash thoroughly, paying special attention to the area where your surgery will be performed.  7.  Thoroughly rinse your body with warm water from the neck down.  8.  DO NOT shower/wash with your normal soap after using and rinsing off the CHG Soap.  9.  Pat yourself dry with a clean towel.            10 .  Wear clean pajamas.            11.  Place clean sheets on your bed the night of your first shower and do not sleep with pets.  Day of Surgery  Do not apply any lotions/deodorants the morning of surgery.  Please wear clean clothes to the hospital/surgery center.   Please read over the following fact sheets that you were given: Pain Booklet, Coughing and Deep Breathing, MRSA Information and Surgical Site Infection Prevention

## 2014-01-01 ENCOUNTER — Encounter (HOSPITAL_COMMUNITY): Payer: Self-pay

## 2014-01-01 ENCOUNTER — Encounter (HOSPITAL_COMMUNITY)
Admission: RE | Admit: 2014-01-01 | Discharge: 2014-01-01 | Disposition: A | Discharge status: Home / Self Care | Payer: BC Managed Care – PPO | Source: Ambulatory Visit | Attending: Orthopedic Surgery | Admitting: Orthopedic Surgery

## 2014-01-01 DIAGNOSIS — Z01812 Encounter for preprocedural laboratory examination: Secondary | ICD-10-CM | POA: Insufficient documentation

## 2014-01-01 LAB — CBC WITH DIFFERENTIAL/PLATELET
BASOS ABS: 0.1 10*3/uL (ref 0.0–0.1)
Basophils Relative: 1 % (ref 0–1)
EOS ABS: 0.2 10*3/uL (ref 0.0–0.7)
Eosinophils Relative: 5 % (ref 0–5)
HCT: 39.9 % (ref 36.0–46.0)
Hemoglobin: 13.6 g/dL (ref 12.0–15.0)
LYMPHS PCT: 43 % (ref 12–46)
Lymphs Abs: 2.2 10*3/uL (ref 0.7–4.0)
MCH: 31.1 pg (ref 26.0–34.0)
MCHC: 34.1 g/dL (ref 30.0–36.0)
MCV: 91.1 fL (ref 78.0–100.0)
Monocytes Absolute: 0.6 10*3/uL (ref 0.1–1.0)
Monocytes Relative: 13 % — ABNORMAL HIGH (ref 3–12)
NEUTROS PCT: 38 % — AB (ref 43–77)
Neutro Abs: 1.9 10*3/uL (ref 1.7–7.7)
Platelets: 239 10*3/uL (ref 150–400)
RBC: 4.38 MIL/uL (ref 3.87–5.11)
RDW: 13.1 % (ref 11.5–15.5)
WBC: 5.1 10*3/uL (ref 4.0–10.5)

## 2014-01-01 LAB — URINALYSIS, ROUTINE W REFLEX MICROSCOPIC
Bilirubin Urine: NEGATIVE
Glucose, UA: NEGATIVE mg/dL
Hgb urine dipstick: NEGATIVE
Ketones, ur: NEGATIVE mg/dL
LEUKOCYTES UA: NEGATIVE
NITRITE: NEGATIVE
PH: 6 (ref 5.0–8.0)
Protein, ur: NEGATIVE mg/dL
SPECIFIC GRAVITY, URINE: 1.008 (ref 1.005–1.030)
Urobilinogen, UA: 0.2 mg/dL (ref 0.0–1.0)

## 2014-01-01 LAB — COMPREHENSIVE METABOLIC PANEL
ALT: 37 U/L — AB (ref 0–35)
AST: 46 U/L — AB (ref 0–37)
Albumin: 4.2 g/dL (ref 3.5–5.2)
Alkaline Phosphatase: 92 U/L (ref 39–117)
Anion gap: 11 (ref 5–15)
BUN: 8 mg/dL (ref 6–23)
CO2: 26 mEq/L (ref 19–32)
Calcium: 9.4 mg/dL (ref 8.4–10.5)
Chloride: 104 mEq/L (ref 96–112)
Creatinine, Ser: 0.56 mg/dL (ref 0.50–1.10)
GFR calc Af Amer: >90 mL/min (ref >90)
GFR calc non Af Amer: >90 mL/min (ref >90)
Glucose, Bld: 88 mg/dL (ref 70–99)
Potassium: 4.7 mEq/L (ref 3.7–5.3)
SODIUM: 141 meq/L (ref 137–147)
TOTAL PROTEIN: 7.4 g/dL (ref 6.0–8.3)
Total Bilirubin: 0.2 mg/dL — ABNORMAL LOW (ref 0.3–1.2)

## 2014-01-01 LAB — PROTIME-INR
INR: 0.96 (ref 0.00–1.49)
Prothrombin Time: 12.9 seconds (ref 11.6–15.2)

## 2014-01-01 LAB — SURGICAL PCR SCREEN
MRSA, PCR: NEGATIVE
Staphylococcus aureus: NEGATIVE

## 2014-01-01 LAB — APTT: aPTT: 28 seconds (ref 24–37)

## 2014-01-03 LAB — URINE CULTURE
COLONY COUNT: NO GROWTH
CULTURE: NO GROWTH

## 2014-01-07 MED ORDER — CHLORHEXIDINE GLUCONATE 4 % EX LIQD
60.0000 mL | Freq: Once | CUTANEOUS | Status: DC
  Filled 2014-01-07: qty 60

## 2014-01-07 MED ORDER — TRANEXAMIC ACID 100 MG/ML IV SOLN
1000.0000 mg | INTRAVENOUS | Status: AC
  Administered 2014-01-08: 1000 mg via INTRAVENOUS
  Filled 2014-01-07: qty 10

## 2014-01-07 MED ORDER — SODIUM CHLORIDE 0.9 % IV SOLN: INTRAVENOUS | Status: DC

## 2014-01-07 MED ORDER — CEFAZOLIN SODIUM-DEXTROSE 2-3 GM-% IV SOLR
2.0000 g | INTRAVENOUS | Status: AC
  Administered 2014-01-08: 2 g via INTRAVENOUS
  Filled 2014-01-07: qty 50

## 2014-01-07 NOTE — Anesthesia Preprocedure Evaluation (Addendum)
Anesthesia Evaluation  Patient identified by MRN, date of birth, ID band Patient awake    Reviewed: Allergy & Precautions, H&P , NPO status , Patient's Chart, lab work & pertinent test results, reviewed documented beta blocker date and time   Airway Mallampati: I  TM Distance: >3 FB Neck ROM: Full    Dental  (+) Teeth Intact, Dental Advisory Given   Pulmonary former smoker,          Cardiovascular Rhythm:Regular  ECHO 2011 normal   Neuro/Psych Anxiety Depression    GI/Hepatic GERD-  Medicated,SP RYGB   Endo/Other    Renal/GU      Musculoskeletal   Abdominal (+)  Abdomen: soft.    Peds  Hematology   Anesthesia Other Findings   Reproductive/Obstetrics                           Anesthesia Physical Anesthesia Plan  ASA: II  Anesthesia Plan: General   Post-op Pain Management: MAC Combined w/ Regional for Post-op pain   Induction: Intravenous  Airway Management Planned: Oral ETT  Additional Equipment:   Intra-op Plan:   Post-operative Plan: Extubation in OR  Informed Consent: I have reviewed the patients History and Physical, chart, labs and discussed the procedure including the risks, benefits and alternatives for the proposed anesthesia with the patient or authorized representative who has indicated his/her understanding and acceptance.     Plan Discussed with:   Anesthesia Plan Comments:         Anesthesia Quick Evaluation

## 2014-01-08 ENCOUNTER — Inpatient Hospital Stay (HOSPITAL_COMMUNITY)
Admission: RE | Admit: 2014-01-08 | Discharge: 2014-01-09 | DRG: 470 | Disposition: A | Discharge status: Home / Self Care | Payer: BC Managed Care – PPO | Source: Ambulatory Visit | Attending: Orthopedic Surgery | Admitting: Orthopedic Surgery

## 2014-01-08 ENCOUNTER — Inpatient Hospital Stay (HOSPITAL_COMMUNITY): Payer: BC Managed Care – PPO | Admitting: Anesthesiology

## 2014-01-08 ENCOUNTER — Encounter (HOSPITAL_COMMUNITY)
Admission: RE | Disposition: A | Discharge status: Home / Self Care | Payer: Self-pay | Source: Ambulatory Visit | Attending: Orthopedic Surgery

## 2014-01-08 ENCOUNTER — Encounter (HOSPITAL_COMMUNITY): Payer: Self-pay | Admitting: *Deleted

## 2014-01-08 DIAGNOSIS — F329 Major depressive disorder, single episode, unspecified: Secondary | ICD-10-CM | POA: Diagnosis present

## 2014-01-08 DIAGNOSIS — F419 Anxiety disorder, unspecified: Secondary | ICD-10-CM | POA: Diagnosis present

## 2014-01-08 DIAGNOSIS — Z9049 Acquired absence of other specified parts of digestive tract: Secondary | ICD-10-CM | POA: Diagnosis present

## 2014-01-08 DIAGNOSIS — Z9884 Bariatric surgery status: Secondary | ICD-10-CM | POA: Diagnosis not present

## 2014-01-08 DIAGNOSIS — E039 Hypothyroidism, unspecified: Secondary | ICD-10-CM | POA: Diagnosis present

## 2014-01-08 DIAGNOSIS — M25561 Pain in right knee: Secondary | ICD-10-CM | POA: Diagnosis present

## 2014-01-08 DIAGNOSIS — K219 Gastro-esophageal reflux disease without esophagitis: Secondary | ICD-10-CM | POA: Diagnosis present

## 2014-01-08 DIAGNOSIS — Z886 Allergy status to analgesic agent status: Secondary | ICD-10-CM | POA: Diagnosis not present

## 2014-01-08 DIAGNOSIS — Z96659 Presence of unspecified artificial knee joint: Secondary | ICD-10-CM

## 2014-01-08 DIAGNOSIS — M1711 Unilateral primary osteoarthritis, right knee: Principal | ICD-10-CM | POA: Diagnosis present

## 2014-01-08 DIAGNOSIS — Z90710 Acquired absence of both cervix and uterus: Secondary | ICD-10-CM | POA: Diagnosis not present

## 2014-01-08 DIAGNOSIS — Z96652 Presence of left artificial knee joint: Secondary | ICD-10-CM | POA: Diagnosis present

## 2014-01-08 DIAGNOSIS — Z87891 Personal history of nicotine dependence: Secondary | ICD-10-CM

## 2014-01-08 DIAGNOSIS — Z79899 Other long term (current) drug therapy: Secondary | ICD-10-CM

## 2014-01-08 HISTORY — PX: TOTAL KNEE ARTHROPLASTY: SHX125

## 2014-01-08 LAB — CBC
HCT: 35.9 % — ABNORMAL LOW (ref 36.0–46.0)
Hemoglobin: 12.1 g/dL (ref 12.0–15.0)
MCH: 30.5 pg (ref 26.0–34.0)
MCHC: 33.7 g/dL (ref 30.0–36.0)
MCV: 90.4 fL (ref 78.0–100.0)
PLATELETS: 222 10*3/uL (ref 150–400)
RBC: 3.97 MIL/uL (ref 3.87–5.11)
RDW: 13 % (ref 11.5–15.5)
WBC: 7.1 10*3/uL (ref 4.0–10.5)

## 2014-01-08 LAB — CREATININE, SERUM
Creatinine, Ser: 0.55 mg/dL (ref 0.50–1.10)
GFR calc Af Amer: >90 mL/min (ref >90)

## 2014-01-08 SURGERY — ARTHROPLASTY, KNEE, TOTAL
Anesthesia: General | Site: Knee | Laterality: Right

## 2014-01-08 MED ORDER — PROPOFOL 10 MG/ML IV BOLUS
INTRAVENOUS | Status: DC | PRN
  Administered 2014-01-08: 150 mg via INTRAVENOUS

## 2014-01-08 MED ORDER — METOCLOPRAMIDE HCL 5 MG PO TABS
5.0000 mg | ORAL_TABLET | Freq: Three times a day (TID) | ORAL | Status: DC | PRN
  Filled 2014-01-08: qty 2

## 2014-01-08 MED ORDER — BISACODYL 5 MG PO TBEC: 5.0000 mg | DELAYED_RELEASE_TABLET | Freq: Every day | ORAL | Status: DC | PRN

## 2014-01-08 MED ORDER — PHENYLEPHRINE HCL 10 MG/ML IJ SOLN
INTRAMUSCULAR | Status: DC | PRN
Start: 2014-01-08 — End: 2014-01-08
  Administered 2014-01-08: 80 ug via INTRAVENOUS
  Administered 2014-01-08: 40 ug via INTRAVENOUS
  Administered 2014-01-08 (×2): 80 ug via INTRAVENOUS
  Administered 2014-01-08 (×2): 40 ug via INTRAVENOUS

## 2014-01-08 MED ORDER — ACETAMINOPHEN 10 MG/ML IV SOLN
1000.0000 mg | Freq: Once | INTRAVENOUS | Status: DC
  Filled 2014-01-08: qty 100

## 2014-01-08 MED ORDER — SODIUM CHLORIDE 0.9 % IV SOLN: INTRAVENOUS | Status: DC

## 2014-01-08 MED ORDER — FENTANYL CITRATE 0.05 MG/ML IJ SOLN
INTRAMUSCULAR | Status: AC
  Filled 2014-01-08: qty 5

## 2014-01-08 MED ORDER — ARTIFICIAL TEARS OP OINT
TOPICAL_OINTMENT | OPHTHALMIC | Status: DC | PRN
  Administered 2014-01-08: 1 via OPHTHALMIC

## 2014-01-08 MED ORDER — ESTRADIOL 0.05 MG/24HR TD PTWK: 0.0500 mg | MEDICATED_PATCH | TRANSDERMAL | Status: DC

## 2014-01-08 MED ORDER — INFLUENZA VAC SPLIT QUAD 0.5 ML IM SUSY
0.5000 mL | PREFILLED_SYRINGE | INTRAMUSCULAR | Status: AC
  Administered 2014-01-09: 0.5 mL via INTRAMUSCULAR
  Filled 2014-01-08: qty 0.5

## 2014-01-08 MED ORDER — MIDAZOLAM HCL 2 MG/2ML IJ SOLN
INTRAMUSCULAR | Status: AC
  Filled 2014-01-08: qty 2

## 2014-01-08 MED ORDER — ONDANSETRON HCL 4 MG/2ML IJ SOLN: 4.0000 mg | Freq: Four times a day (QID) | INTRAMUSCULAR | Status: DC | PRN

## 2014-01-08 MED ORDER — SENNOSIDES-DOCUSATE SODIUM 8.6-50 MG PO TABS: 1.0000 | ORAL_TABLET | Freq: Every evening | ORAL | Status: DC | PRN

## 2014-01-08 MED ORDER — LACTATED RINGERS IV SOLN
INTRAVENOUS | Status: DC | PRN
  Administered 2014-01-08 (×2): via INTRAVENOUS

## 2014-01-08 MED ORDER — BUPIVACAINE-EPINEPHRINE (PF) 0.5% -1:200000 IJ SOLN
INTRAMUSCULAR | Status: AC
  Filled 2014-01-08: qty 30

## 2014-01-08 MED ORDER — CEFAZOLIN SODIUM 1-5 GM-% IV SOLN
1.0000 g | Freq: Four times a day (QID) | INTRAVENOUS | Status: AC
  Administered 2014-01-08 (×2): 1 g via INTRAVENOUS
  Filled 2014-01-08 (×2): qty 50

## 2014-01-08 MED ORDER — BUPIVACAINE-EPINEPHRINE (PF) 0.5% -1:200000 IJ SOLN
INTRAMUSCULAR | Status: DC | PRN
  Administered 2014-01-08: 30 mL via PERINEURAL

## 2014-01-08 MED ORDER — BUPIVACAINE LIPOSOME 1.3 % IJ SUSP
INTRAMUSCULAR | Status: DC | PRN
  Administered 2014-01-08: 20 mL

## 2014-01-08 MED ORDER — HYDROMORPHONE HCL 1 MG/ML IJ SOLN
1.0000 mg | INTRAMUSCULAR | Status: DC | PRN
  Administered 2014-01-08 – 2014-01-09 (×6): 1 mg via INTRAVENOUS
  Filled 2014-01-08 (×6): qty 1

## 2014-01-08 MED ORDER — ESCITALOPRAM OXALATE 20 MG PO TABS
20.0000 mg | ORAL_TABLET | Freq: Every day | ORAL | Status: DC
  Administered 2014-01-09: 20 mg via ORAL
  Filled 2014-01-08 (×2): qty 1

## 2014-01-08 MED ORDER — ROCURONIUM BROMIDE 50 MG/5ML IV SOLN
INTRAVENOUS | Status: AC
  Filled 2014-01-08: qty 1

## 2014-01-08 MED ORDER — ACETAMINOPHEN 500 MG PO TABS
1000.0000 mg | ORAL_TABLET | Freq: Four times a day (QID) | ORAL | Status: AC
  Administered 2014-01-08 – 2014-01-09 (×3): 1000 mg via ORAL
  Filled 2014-01-08 (×4): qty 2

## 2014-01-08 MED ORDER — FENTANYL CITRATE 0.05 MG/ML IJ SOLN
INTRAMUSCULAR | Status: AC
Start: 2014-01-08 — End: 2014-01-08
  Administered 2014-01-08: 50 ug via INTRAVENOUS
  Filled 2014-01-08: qty 2

## 2014-01-08 MED ORDER — DIPHENHYDRAMINE HCL 12.5 MG/5ML PO ELIX: 12.5000 mg | ORAL_SOLUTION | ORAL | Status: DC | PRN

## 2014-01-08 MED ORDER — OXYCODONE HCL ER 10 MG PO T12A
10.0000 mg | EXTENDED_RELEASE_TABLET | Freq: Two times a day (BID) | ORAL | Status: DC
  Administered 2014-01-08 – 2014-01-09 (×3): 10 mg via ORAL
  Filled 2014-01-08 (×3): qty 1

## 2014-01-08 MED ORDER — PROPOFOL 10 MG/ML IV BOLUS
INTRAVENOUS | Status: AC
  Filled 2014-01-08: qty 20

## 2014-01-08 MED ORDER — DOCUSATE SODIUM 100 MG PO CAPS
100.0000 mg | ORAL_CAPSULE | Freq: Two times a day (BID) | ORAL | Status: DC
  Administered 2014-01-08 – 2014-01-09 (×3): 100 mg via ORAL
  Filled 2014-01-08 (×3): qty 1

## 2014-01-08 MED ORDER — FENTANYL CITRATE 0.05 MG/ML IJ SOLN
INTRAMUSCULAR | Status: AC
  Administered 2014-01-08: 50 ug via INTRAVENOUS
  Filled 2014-01-08: qty 2

## 2014-01-08 MED ORDER — ALUM & MAG HYDROXIDE-SIMETH 200-200-20 MG/5ML PO SUSP: 30.0000 mL | ORAL | Status: DC | PRN

## 2014-01-08 MED ORDER — DEXTROSE 5 % IV SOLN
500.0000 mg | Freq: Four times a day (QID) | INTRAVENOUS | Status: DC | PRN
  Administered 2014-01-08: 500 mg via INTRAVENOUS
  Filled 2014-01-08 (×2): qty 5

## 2014-01-08 MED ORDER — FENTANYL CITRATE 0.05 MG/ML IJ SOLN
25.0000 ug | INTRAMUSCULAR | Status: DC | PRN
  Administered 2014-01-08 (×3): 50 ug via INTRAVENOUS

## 2014-01-08 MED ORDER — KETOROLAC TROMETHAMINE 30 MG/ML IJ SOLN
INTRAMUSCULAR | Status: AC
Start: 2014-01-08 — End: 2014-01-08
  Administered 2014-01-08: 30 mg via INTRAVENOUS
  Filled 2014-01-08: qty 1

## 2014-01-08 MED ORDER — KETOROLAC TROMETHAMINE 30 MG/ML IJ SOLN
30.0000 mg | Freq: Once | INTRAMUSCULAR | Status: AC
  Administered 2014-01-08: 30 mg via INTRAVENOUS

## 2014-01-08 MED ORDER — GLYCOPYRROLATE 0.2 MG/ML IJ SOLN
INTRAMUSCULAR | Status: DC | PRN
  Administered 2014-01-08: 0.2 mg via INTRAVENOUS

## 2014-01-08 MED ORDER — DEXAMETHASONE SODIUM PHOSPHATE 4 MG/ML IJ SOLN
INTRAMUSCULAR | Status: AC
  Filled 2014-01-08: qty 2

## 2014-01-08 MED ORDER — MIDAZOLAM HCL 5 MG/5ML IJ SOLN
INTRAMUSCULAR | Status: DC | PRN
  Administered 2014-01-08: 2 mg via INTRAVENOUS

## 2014-01-08 MED ORDER — FLEET ENEMA 7-19 GM/118ML RE ENEM: 1.0000 | ENEMA | Freq: Once | RECTAL | Status: AC | PRN

## 2014-01-08 MED ORDER — ACETAMINOPHEN 10 MG/ML IV SOLN
INTRAVENOUS | Status: DC | PRN
  Administered 2014-01-08: 1000 mg via INTRAVENOUS

## 2014-01-08 MED ORDER — MENTHOL 3 MG MT LOZG: 1.0000 | LOZENGE | OROMUCOSAL | Status: DC | PRN

## 2014-01-08 MED ORDER — ACETAMINOPHEN 650 MG RE SUPP: 650.0000 mg | Freq: Four times a day (QID) | RECTAL | Status: DC | PRN

## 2014-01-08 MED ORDER — ALPRAZOLAM 0.5 MG PO TABS: 0.5000 mg | ORAL_TABLET | Freq: Every evening | ORAL | Status: DC | PRN

## 2014-01-08 MED ORDER — 0.9 % SODIUM CHLORIDE (POUR BTL) OPTIME
TOPICAL | Status: DC | PRN
  Administered 2014-01-08: 1000 mL

## 2014-01-08 MED ORDER — ARTIFICIAL TEARS OP OINT
TOPICAL_OINTMENT | OPHTHALMIC | Status: AC
  Filled 2014-01-08: qty 3.5

## 2014-01-08 MED ORDER — MEPERIDINE HCL 25 MG/ML IJ SOLN: 6.2500 mg | INTRAMUSCULAR | Status: DC | PRN

## 2014-01-08 MED ORDER — ACETAMINOPHEN 325 MG PO TABS: 650.0000 mg | ORAL_TABLET | Freq: Four times a day (QID) | ORAL | Status: DC | PRN

## 2014-01-08 MED ORDER — FENTANYL CITRATE 0.05 MG/ML IJ SOLN
INTRAMUSCULAR | Status: DC | PRN
  Administered 2014-01-08 (×2): 50 ug via INTRAVENOUS
  Administered 2014-01-08: 25 ug via INTRAVENOUS
  Administered 2014-01-08 (×3): 50 ug via INTRAVENOUS
  Administered 2014-01-08: 25 ug via INTRAVENOUS
  Administered 2014-01-08: 50 ug via INTRAVENOUS
  Administered 2014-01-08: 25 ug via INTRAVENOUS
  Administered 2014-01-08: 50 ug via INTRAVENOUS

## 2014-01-08 MED ORDER — BUPIVACAINE-EPINEPHRINE 0.5% -1:200000 IJ SOLN
INTRAMUSCULAR | Status: DC | PRN
  Administered 2014-01-08: 30 mL

## 2014-01-08 MED ORDER — ONDANSETRON HCL 4 MG PO TABS: 4.0000 mg | ORAL_TABLET | Freq: Four times a day (QID) | ORAL | Status: DC | PRN

## 2014-01-08 MED ORDER — ENOXAPARIN SODIUM 30 MG/0.3ML ~~LOC~~ SOLN
30.0000 mg | Freq: Two times a day (BID) | SUBCUTANEOUS | Status: DC
Start: 2014-01-09 — End: 2014-01-09
  Administered 2014-01-09: 30 mg via SUBCUTANEOUS
  Filled 2014-01-08 (×3): qty 0.3

## 2014-01-08 MED ORDER — METOCLOPRAMIDE HCL 5 MG/ML IJ SOLN: 5.0000 mg | Freq: Three times a day (TID) | INTRAMUSCULAR | Status: DC | PRN

## 2014-01-08 MED ORDER — LIDOCAINE HCL (CARDIAC) 20 MG/ML IV SOLN
INTRAVENOUS | Status: AC
  Filled 2014-01-08: qty 5

## 2014-01-08 MED ORDER — ZOLPIDEM TARTRATE 5 MG PO TABS: 5.0000 mg | ORAL_TABLET | Freq: Every evening | ORAL | Status: DC | PRN

## 2014-01-08 MED ORDER — PHENOL 1.4 % MT LIQD: 1.0000 | OROMUCOSAL | Status: DC | PRN

## 2014-01-08 MED ORDER — METHOCARBAMOL 500 MG PO TABS
500.0000 mg | ORAL_TABLET | Freq: Four times a day (QID) | ORAL | Status: DC | PRN
  Administered 2014-01-09 (×2): 500 mg via ORAL
  Filled 2014-01-08 (×3): qty 1

## 2014-01-08 MED ORDER — OXYCODONE HCL 5 MG PO TABS
5.0000 mg | ORAL_TABLET | ORAL | Status: DC | PRN
  Administered 2014-01-08 – 2014-01-09 (×7): 10 mg via ORAL
  Filled 2014-01-08 (×7): qty 2

## 2014-01-08 MED ORDER — ONDANSETRON HCL 4 MG/2ML IJ SOLN
INTRAMUSCULAR | Status: DC | PRN
  Administered 2014-01-08: 4 mg via INTRAVENOUS

## 2014-01-08 MED ORDER — LIDOCAINE HCL (CARDIAC) 20 MG/ML IV SOLN
INTRAVENOUS | Status: DC | PRN
  Administered 2014-01-08: 100 mg via INTRAVENOUS

## 2014-01-08 MED ORDER — BUPIVACAINE LIPOSOME 1.3 % IJ SUSP
20.0000 mL | Freq: Once | INTRAMUSCULAR | Status: DC
  Filled 2014-01-08: qty 20

## 2014-01-08 MED ORDER — ESTRADIOL 0.1 MG/24HR TD PTWK: 0.1000 mg | MEDICATED_PATCH | TRANSDERMAL | Status: DC

## 2014-01-08 MED ORDER — SODIUM CHLORIDE 0.9 % IR SOLN
Status: DC | PRN
  Administered 2014-01-08: 3000 mL

## 2014-01-08 MED ORDER — PROMETHAZINE HCL 25 MG/ML IJ SOLN: 6.2500 mg | INTRAMUSCULAR | Status: DC | PRN

## 2014-01-08 SURGICAL SUPPLY — 57 items
BANDAGE ESMARK 6X9 LF (GAUZE/BANDAGES/DRESSINGS) ×1 IMPLANT
BLADE SAGITTAL 13X1.27X60 (BLADE) ×2 IMPLANT
BLADE SAGITTAL 13X1.27X60MM (BLADE) ×1
BLADE SAW SGTL 83.5X18.5 (BLADE) ×3 IMPLANT
BLADE SURG 10 STRL SS (BLADE) ×3 IMPLANT
BNDG ESMARK 6X9 LF (GAUZE/BANDAGES/DRESSINGS) ×3
BOWL SMART MIX CTS (DISPOSABLE) ×3 IMPLANT
CAPT KNEE TOTAL 3 ×3 IMPLANT
CEMENT BONE SIMPLEX SPEEDSET (Cement) ×6 IMPLANT
COVER SURGICAL LIGHT HANDLE (MISCELLANEOUS) ×3 IMPLANT
CUFF TOURNIQUET SINGLE 34IN LL (TOURNIQUET CUFF) ×3 IMPLANT
DRAPE EXTREMITY T 121X128X90 (DRAPE) ×3 IMPLANT
DRAPE IMP U-DRAPE 54X76 (DRAPES) IMPLANT
DRAPE INCISE IOBAN 66X45 STRL (DRAPES) ×6 IMPLANT
DRAPE PROXIMA HALF (DRAPES) ×3 IMPLANT
DRAPE U-SHAPE 47X51 STRL (DRAPES) ×3 IMPLANT
DRSG ADAPTIC 3X8 NADH LF (GAUZE/BANDAGES/DRESSINGS) ×3 IMPLANT
DRSG PAD ABDOMINAL 8X10 ST (GAUZE/BANDAGES/DRESSINGS) ×3 IMPLANT
DURAPREP 26ML APPLICATOR (WOUND CARE) ×3 IMPLANT
ELECT REM PT RETURN 9FT ADLT (ELECTROSURGICAL) ×3
ELECTRODE REM PT RTRN 9FT ADLT (ELECTROSURGICAL) ×1 IMPLANT
EVACUATOR 1/8 PVC DRAIN (DRAIN) ×3 IMPLANT
GAUZE SPONGE 4X4 12PLY STRL (GAUZE/BANDAGES/DRESSINGS) ×3 IMPLANT
GLOVE BIOGEL M 7.0 STRL (GLOVE) IMPLANT
GLOVE BIOGEL PI IND STRL 7.5 (GLOVE) IMPLANT
GLOVE BIOGEL PI IND STRL 8.5 (GLOVE) ×2 IMPLANT
GLOVE BIOGEL PI INDICATOR 7.5 (GLOVE)
GLOVE BIOGEL PI INDICATOR 8.5 (GLOVE) ×4
GLOVE SURG ORTHO 8.0 STRL STRW (GLOVE) ×9 IMPLANT
GOWN STRL REUS W/ TWL LRG LVL3 (GOWN DISPOSABLE) ×1 IMPLANT
GOWN STRL REUS W/ TWL XL LVL3 (GOWN DISPOSABLE) ×2 IMPLANT
GOWN STRL REUS W/TWL LRG LVL3 (GOWN DISPOSABLE) ×2
GOWN STRL REUS W/TWL XL LVL3 (GOWN DISPOSABLE) ×4
HANDPIECE INTERPULSE COAX TIP (DISPOSABLE) ×2
HOOD PEEL AWAY FACE SHEILD DIS (HOOD) ×9 IMPLANT
KIT BASIN OR (CUSTOM PROCEDURE TRAY) ×3 IMPLANT
KIT ROOM TURNOVER OR (KITS) ×3 IMPLANT
KNEE CAPITATED TOTAL 3 ×1 IMPLANT
MANIFOLD NEPTUNE II (INSTRUMENTS) ×3 IMPLANT
NEEDLE 22X1 1/2 (OR ONLY) (NEEDLE) ×6 IMPLANT
NS IRRIG 1000ML POUR BTL (IV SOLUTION) ×3 IMPLANT
PACK TOTAL JOINT (CUSTOM PROCEDURE TRAY) ×3 IMPLANT
PACK UNIVERSAL I (CUSTOM PROCEDURE TRAY) ×3 IMPLANT
PAD ARMBOARD 7.5X6 YLW CONV (MISCELLANEOUS) ×3 IMPLANT
PADDING CAST COTTON 6X4 STRL (CAST SUPPLIES) ×3 IMPLANT
SET HNDPC FAN SPRY TIP SCT (DISPOSABLE) ×1 IMPLANT
STAPLER VISISTAT 35W (STAPLE) ×3 IMPLANT
SUCTION FRAZIER TIP 10 FR DISP (SUCTIONS) ×3 IMPLANT
SUT BONE WAX W31G (SUTURE) ×3 IMPLANT
SUT VIC AB 0 CTB1 27 (SUTURE) ×6 IMPLANT
SUT VIC AB 1 CT1 27 (SUTURE) ×4
SUT VIC AB 1 CT1 27XBRD ANBCTR (SUTURE) ×2 IMPLANT
SUT VIC AB 2-0 CT1 27 (SUTURE) ×4
SUT VIC AB 2-0 CT1 TAPERPNT 27 (SUTURE) ×2 IMPLANT
SYR 20CC LL (SYRINGE) ×6 IMPLANT
TOWEL OR 17X24 6PK STRL BLUE (TOWEL DISPOSABLE) ×3 IMPLANT
TOWEL OR 17X26 10 PK STRL BLUE (TOWEL DISPOSABLE) ×3 IMPLANT

## 2014-01-08 NOTE — Plan of Care (Signed)
Problem: Consults Goal: Diagnosis- Total Joint Replacement Primary Total Knee right     
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
## 2014-01-08 NOTE — H&P (Signed)
Courtney Morton MRN:  [id number] DOB/SEX:  11/20/1960/female  CHIEF COMPLAINT:  Painful right Knee  HISTORY: Patient is a 53 y.o. female presented with a history of pain in the right knee. Onset of symptoms was gradual starting several years ago with gradually worsening course since that time. Prior procedures on the knee include arthroscopy. Patient has been treated conservatively with over-the-counter NSAIDs and activity modification. Patient currently rates pain in the knee at 10 out of 10 with activity. There is pain at night.  PAST MEDICAL HISTORY: Patient Active Problem List   Diagnosis Date Noted  . 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 Anxiety   . Insomnia     Hx: of  . Depression   . Hypothyroidism     "not on RX anymore" (02/27/2013)  . H/O hiatal hernia   . 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 OA (osteoarthritis)     back, hands, knee   Past Surgical History  Procedure Laterality Date  . Abdominal hysterectomy    . Appendectomy    . Tonsillectomy    . Cholecystectomy    . Roux-en-y gastric bypass  2013    w/hernia repair  . Carpal tunnel release Right ~ 2009  . Plantar fascia surgery    . Tarsal tunnel release Left ~ 2012  . Bunionectomy with hammertoe reconstruction Left 2014  . Knee arthroscopy Left   . Goiter removed  1965  . Colonoscopy w/ biopsies and polypectomy    . Hernia repair  2013    w/gastric bypass  . Shoulder arthroscopy Right   . Total knee arthroplasty Left 02/27/2013  . Tubal ligation  1985  . Total knee arthroplasty Left 02/27/2013    Procedure: TOTAL KNEE ARTHROPLASTY;  Surgeon: Dannielle HuhSteve Lucey, MD;  Location: [facility] OR;  Service: Orthopedics;  Laterality: Left;     MEDICATIONS:   Prescriptions prior to admission  Medication Sig Dispense Refill Last Dose  . ALPRAZolam (XANAX) 0.5 MG tablet Take 0.5 mg by mouth at bedtime as needed for anxiety.   01/07/2014  at Unknown time  . escitalopram (LEXAPRO) 20 MG tablet Take 20 mg by mouth daily before breakfast.    01/07/2014 at Unknown time  . estradiol (VIVELLE-DOT) 0.1 MG/24HR patch Place 1 patch onto the skin 2 (two) times a week. Sunday and Wednesday   01/07/2014 at Unknown time  . HYDROcodone-acetaminophen (NORCO) 10-325 MG per tablet Take 1 tablet by mouth every 8 (eight) hours as needed (for pain).   0 01/07/2014 at Unknown time  . enoxaparin (LOVENOX) 40 MG/0.4ML injection Inject 0.4 mLs (40 mg total) into the skin daily. (Patient not taking: Reported on 12/28/2013) 12 Syringe 0 Not Taking at Unknown time  . methocarbamol (ROBAXIN) 500 MG tablet Take 1-2 tablets (500-1,000 mg total) by mouth every 6 (six) hours as needed for muscle spasms. (Patient not taking: Reported on 12/28/2013) 60 tablet 2 Not Taking at Unknown time  . oxyCODONE (OXY IR/ROXICODONE) 5 MG immediate release tablet Take 1-2 tablets (5-10 mg total) by mouth every 3 (three) hours as needed for breakthrough pain. (Patient not taking: Reported on 12/28/2013) 90 tablet 0 Not Taking at Unknown time  . OxyCODONE (OXYCONTIN) 10 mg T12A 12 hr tablet Take 1 tablet (10 mg total) by mouth every 12 (twelve) hours. (Patient not taking: Reported on 12/28/2013) 30 tablet 0 Not Taking at Unknown time    ALLERGIES:  Allergies  Allergen Reactions  . Aspirin     Due to gastric bypass surgery    . Nsaids     Hx of gastric bypass    REVIEW OF SYSTEMS:  A comprehensive review of systems was negative.   FAMILY HISTORY:   Family History  Problem Relation Age of Onset  . Heart disease Mother   . Lung disease Mother   . Dementia Father   . Hypertension Father   . Cancer - Other Brother     SOCIAL HISTORY:   History  Substance Use Topics  . Smoking status: Former Smoker -- 0.50 packs/day for 4 years    Types: Cigarettes  . Smokeless tobacco: Never Used     Comment: quit smoking ciigarettes 1983  . Alcohol Use: 1.2 oz/week    2 Shots of  liquor per week     Comment: 02/27/2013 "1 vodka drink couple times/wk"     EXAMINATION:  Vital signs in last 24 hours: Temp:  [98 F (36.7 C)] 98 F (36.7 C) (12/28 0601) Pulse Rate:  [58] 58 (12/28 0601) Resp:  [20] 20 (12/28 0601) BP: (102)/(56) 102/56 mmHg (12/28 0601) SpO2:  [98 %] 98 % (12/28 0601)  General appearance: alert, cooperative and no distress Lungs: clear to auscultation bilaterally Heart: regular rate and rhythm, S1, S2 normal, no murmur, click, rub or gallop Abdomen: soft, non-tender; bowel sounds normal; no masses,  no organomegaly Extremities: extremities normal, atraumatic, no cyanosis or edema and Homans sign is negative, no sign of DVT Pulses: 2+ and symmetric Skin: Skin color, texture, turgor normal. No rashes or lesions Neurologic: Alert and oriented X 3, normal strength and tone. Normal symmetric reflexes. Normal coordination and gait  Musculoskeletal:  ROM 0-115, Ligaments intact,  Imaging Review Plain radiographs demonstrate severe degenerative joint disease of the right knee. The overall alignment is significant varus. The bone quality appears to be good for age and reported activity level.  Assessment/Plan: Primary osteoarthritis, right knee   The patient history, physical examination and imaging studies are consistent with advanced degenerative joint disease of the right knee. The patient has failed conservative treatment.  The clearance notes were reviewed.  After discussion with the patient it was felt that Total Knee Replacement was indicated. The procedure,  risks, and benefits of total knee arthroplasty were presented and reviewed. The risks including but not limited to aseptic loosening, infection, blood clots, vascular injury, stiffness, patella tracking problems complications among others were discussed. The patient acknowledged the explanation, agreed to proceed with the plan.  Courtney Morton 01/08/2014, 7:03 AM

## 2014-01-08 NOTE — Anesthesia Procedure Notes (Addendum)
Anesthesia Regional Block:  Adductor canal block  Pre-Anesthetic Checklist: ,, timeout performed, Correct Patient, Correct Site, Correct Laterality, Correct Procedure, Correct Position, site marked, Risks and benefits discussed,  Surgical consent,  Pre-op evaluation,  At surgeon's request and post-op pain management  Laterality: Right  Prep: Maximum Sterile Barrier Precautions used and chloraprep       Needles:  Injection technique: Single-shot  Needle Type: Echogenic Stimulator Needle     Needle Length: 10cm 10 cm Needle Gauge: 21 and 21 G    Additional Needles: Adductor canal block Narrative:    Procedure Name: LMA Insertion Date/Time: 01/08/2014 7:38 AM Performed by: Lanell MatarBAKER, Jaken Fregia M Pre-anesthesia Checklist: Patient identified, Timeout performed, Emergency Drugs available, Suction available and Patient being monitored Patient Re-evaluated:Patient Re-evaluated prior to inductionOxygen Delivery Method: Circle system utilized Preoxygenation: Pre-oxygenation with 100% oxygen Intubation Type: IV induction Ventilation: Mask ventilation without difficulty LMA: LMA inserted LMA Size: 4.0 Number of attempts: 1 Placement Confirmation: positive ETCO2 and breath sounds checked- equal and bilateral Tube secured with: Tape Dental Injury: Teeth and Oropharynx as per pre-operative assessment    R AC block with 30ml .5% marcaine with epi, multiple asp, talked to patient throughout, US guided, no complications, easy injection low pressure               Ac block

## 2014-01-08 NOTE — Op Note (Signed)
TOTAL KNEE REPLACEMENT OPERATIVE NOTE:  01/08/2014  9:03 AM  PATIENT:  Courtney PillingNancy Cartelli  53 y.o. female  PRE-OPERATIVE DIAGNOSIS:  primary osteoarthritis right knee  POST-OPERATIVE DIAGNOSIS:  primary osteoarthritis right knee  PROCEDURE:  Procedure(s): TOTAL KNEE ARTHROPLASTY  SURGEON:  Surgeon(s): Dannielle HuhSteve Anayansi Rundquist, MD  PHYSICIAN ASSISTANT: Altamese CabalMaurice Jones, [facility]  ANESTHESIA:   general  DRAINS: Hemovac  SPECIMEN: None  COUNTS:  Correct  TOURNIQUET:   Total Tourniquet Time Documented: Thigh (Right) - 45 minutes Total: Thigh (Right) - 45 minutes   DICTATION:  Indication for procedure:    The patient is a 53 y.o. female who has failed conservative treatment for primary osteoarthritis right knee.  Informed consent was obtained prior to anesthesia. The risks versus benefits of the operation were explain and in a way the patient can, and did, understand.   On the implant demand matching protocol, this patient scored 10.  Therefore, this patient was not receive a polyethylene insert with vitamin E which is a high demand implant.  Description of procedure:     The patient was taken to the operating room and placed under anesthesia.  The patient was positioned in the usual fashion taking care that all body parts were adequately padded and/or protected.  I foley catheter was not placed.  A tourniquet was applied and the leg prepped and draped in the usual sterile fashion.  The extremity was exsanguinated with the esmarch and tourniquet inflated to 350 mmHg.  Pre-operative range of motion was 0-120 degrees flexion.  The knee was in 5 degree of mild valgus.  A midline incision approximately 6-7 inches long was made with a #10 blade.  A new blade was used to make a parapatellar arthrotomy going 2-3 cm into the quadriceps tendon, over the patella, and alongside the medial aspect of the patellar tendon.  A synovectomy was then performed with the #10 blade and forceps. I then elevated the deep MCL  off the medial tibial metaphysis subperiosteally around to the semimembranosus attachment.    I everted the patella and used calipers to measure patellar thickness.  I used the reamer to ream down to appropriate thickness to recreate the native thickness.  I then removed excess bone with the rongeur and sagittal saw.  I used the appropriately sized template and drilled the three lug holes.  I then put the trial in place and measured the thickness with the calipers to ensure recreation of the native thickness.  The trial was then removed and the patella subluxed and the knee brought into flexion.  A homan retractor was place to retract and protect the patella and lateral structures.  A Z-retractor was place medially to protect the medial structures.  The extra-medullary alignment system was used to make cut the tibial articular surface perpendicular to the anamotic axis of the tibia and in 3 degrees of posterior slope.  The cut surface and alignment jig was removed.  I then used the intramedullary alignment guide to make a 6 valgus cut on the distal femur.  I then marked out the epicondylar axis on the distal femur.  The posterior condylar axis measured 5 degrees.  I then used the anterior referencing sizer and measured the femur to be a size 6.  The 4-In-1 cutting block was screwed into place in external rotation matching the posterior condylar angle, making our cuts perpendicular to the epicondylar axis.  Anterior, posterior and chamfer cuts were made with the sagittal saw.  The cutting block and cut pieces  were removed.  A lamina spreader was placed in 90 degrees of flexion.  The ACL, PCL, menisci, and posterior condylar osteophytes were removed.  A 10 mm spacer blocked was found to offer good flexion and extension gap balance after minimal in degree releasing.   The scoop retractor was then placed and the femoral finishing block was pinned in place.  The small sagittal saw was used as well as the lug  drill to finish the femur.  The block and cut surfaces were removed and the medullary canal hole filled with autograft bone from the cut pieces.  The tibia was delivered forward in deep flexion and external rotation.  A size D tray was selected and pinned into place centered on the medial 1/3 of the tibial tubercle.  The reamer and keel was used to prepare the tibia through the tray.    I then trialed with the size 6 femur, size D tibia, a 10 mm insert and the 32 patella.  I had excellent flexion/extension gap balance, excellent patella tracking.  Flexion was full and beyond 120 degrees; extension was zero.  These components were chosen and the staff opened them to me on the back table while the knee was lavaged copiously and the cement mixed.  The soft tissue was infiltrated with 60cc of exparel 1.3% through a 21 gauge needle.  I cemented in the components and removed all excess cement.  The polyethylene tibial component was snapped into place and the knee placed in extension while cement was hardening.  The capsule was infilltrated with 30cc of .25% Marcaine with epinephrine.  A hemovac was place in the joint exiting superolaterally.  A pain pump was place superomedially superficial to the arthrotomy.  Once the cement was hard, the tourniquet was let down.  Hemostasis was obtained.  The arthrotomy was closed with figure-8 #1 vicryl sutures.  The deep soft tissues were closed with #0 vicryls and the subcuticular layer closed with a running #2-0 vicryl.  The skin was reapproximated and closed with skin staples.  The wound was dressed with xeroform, 4 x4's, 2 ABD sponges, a single layer of webril and a TED stocking.   The patient was then awakened, extubated, and taken to the recovery room in stable condition.  BLOOD LOSS:  300cc DRAINS: 1 hemovac, 1 pain catheter COMPLICATIONS:  None.  PLAN OF CARE: Admit to inpatient   PATIENT DISPOSITION:  PACU - hemodynamically stable.   Delay start of  Pharmacological VTE agent (>24hrs) due to surgical blood loss or risk of bleeding:  not applicable  Please fax a copy of this op note to my office at [phone] (please only include page 1 and 2 of the Case Information op note)

## 2014-01-08 NOTE — Anesthesia Postprocedure Evaluation (Signed)
  Anesthesia Post-op Note  Patient: Courtney Morton  Procedure(s) Performed: Procedure(s): TOTAL KNEE ARTHROPLASTY (Right)  Patient Location: PACU  Anesthesia Type:Spinal  Level of Consciousness: awake and alert   Airway and Oxygen Therapy: Patient Spontanous Breathing and Patient connected to nasal cannula oxygen  Post-op Pain: none  Post-op Assessment: Post-op Vital signs reviewed and Patient's Cardiovascular Status Stable  Post-op Vital Signs: Reviewed and stable  Last Vitals:  Filed Vitals:   01/08/14 0918  BP: 129/62  Pulse: 83  Temp: 36.4 C  Resp: 14    Complications: No apparent anesthesia complications

## 2014-01-08 NOTE — Progress Notes (Signed)
Orthopedic Tech Progress Note Patient Details:  Courtney Morton 03/18/1960 [id number] CPM applied to RLE with appropriate settings. OHF applied to bed. Footsie roll provided.  CPM Right Knee CPM Right Knee: On Right Knee Flexion (Degrees): 90 Right Knee Extension (Degrees): 0   Asia R Thompson 01/08/2014, 10:52 AM

## 2014-01-08 NOTE — Transfer of Care (Signed)
Immediate Anesthesia Transfer of Care Note  Patient: Courtney PillingNancy Bentz  Procedure(s) Performed: Procedure(s): TOTAL KNEE ARTHROPLASTY (Right)  Patient Location: PACU  Anesthesia Type:GA combined with regional for post-op pain  Level of Consciousness: awake, alert  and oriented  Airway & Oxygen Therapy: Patient Spontanous Breathing and Patient connected to nasal cannula oxygen  Post-op Assessment: Report given to PACU RN, Post -op Vital signs reviewed and stable and Patient moving all extremities X 4  Post vital signs: Reviewed and stable  Complications: No apparent anesthesia complications

## 2014-01-09 ENCOUNTER — Encounter (HOSPITAL_COMMUNITY): Payer: Self-pay | Admitting: Orthopedic Surgery

## 2014-01-09 LAB — CBC
HCT: 33.2 % — ABNORMAL LOW (ref 36.0–46.0)
HEMOGLOBIN: 11.1 g/dL — AB (ref 12.0–15.0)
MCH: 29.8 pg (ref 26.0–34.0)
MCHC: 33.4 g/dL (ref 30.0–36.0)
MCV: 89.2 fL (ref 78.0–100.0)
Platelets: 224 10*3/uL (ref 150–400)
RBC: 3.72 MIL/uL — ABNORMAL LOW (ref 3.87–5.11)
RDW: 13.1 % (ref 11.5–15.5)
WBC: 6.8 10*3/uL (ref 4.0–10.5)

## 2014-01-09 LAB — BASIC METABOLIC PANEL
Anion gap: 5 (ref 5–15)
CHLORIDE: 103 meq/L (ref 96–112)
CO2: 28 mmol/L (ref 19–32)
Calcium: 8.3 mg/dL — ABNORMAL LOW (ref 8.4–10.5)
Creatinine, Ser: 0.66 mg/dL (ref 0.50–1.10)
GFR calc Af Amer: >90 mL/min (ref >90)
GFR calc non Af Amer: >90 mL/min (ref >90)
Glucose, Bld: 99 mg/dL (ref 70–99)
Potassium: 5 mmol/L (ref 3.5–5.1)
Sodium: 136 mmol/L (ref 135–145)

## 2014-01-09 MED ORDER — OXYCODONE HCL ER 10 MG PO T12A: 10.0000 mg | EXTENDED_RELEASE_TABLET | Freq: Two times a day (BID) | ORAL | Status: AC

## 2014-01-09 MED ORDER — METHOCARBAMOL 500 MG PO TABS: 500.0000 mg | ORAL_TABLET | Freq: Four times a day (QID) | ORAL | Status: AC | PRN

## 2014-01-09 MED ORDER — OXYCODONE-ACETAMINOPHEN 10-325 MG PO TABS: 1.0000 | ORAL_TABLET | ORAL | Status: AC | PRN

## 2014-01-09 MED ORDER — ENOXAPARIN SODIUM 40 MG/0.4ML ~~LOC~~ SOLN: 40.0000 mg | SUBCUTANEOUS | Status: AC

## 2014-01-09 NOTE — Evaluation (Signed)
Occupational Therapy Evaluation Patient Details Name: Courtney Morton MRN: [id number] DOB: 05/24/1960 Today's Date: 01/09/2014    History of Present Illness Patient is a 53 yo female admitted 01/08/14 now s/p Rt TKA.  PMH:  Hypothyroidism, anxiety, OA, depression, Lt TKA 02/27/13.   Clinical Impression   Pt is independent at baseline. Now performing ADL and ADL transfers at a supervision level.  Mild unsteadiness in standing and pt tends to move quickly.  Educated in home safety.  No further OT needs.    Follow Up Recommendations  No OT follow up    Equipment Recommendations  None recommended by OT    Recommendations for Other Services       Precautions / Restrictions Precautions Precautions: Knee Precaution Booklet Issued: Yes (comment) Precaution Comments: Reviewed precautions with patient. Restrictions Weight Bearing Restrictions: Yes RLE Weight Bearing: Weight bearing as tolerated      Mobility Bed Mobility Overal bed mobility: Modified Independent Bed Mobility: Sit to Supine     Supine to sit: Modified independent (Device/Increase time) Sit to supine: Modified independent (Device/Increase time)   General bed mobility comments: no assist for sit to supine  Transfers Overall transfer level: Needs assistance Equipment used: Rolling walker (2 wheeled) Transfers: Sit to/from Stand Sit to Stand: Supervision         General transfer comment: v/c's for hand placement    Balance                                            ADL Overall ADL's : Needs assistance/impaired Eating/Feeding: Independent   Grooming: Wash/dry hands;Standing;Supervision/safety   Upper Body Bathing: Set up;Sitting   Lower Body Bathing: Supervison/ safety;Sit to/from stand   Upper Body Dressing : Set up;Sitting   Lower Body Dressing: Supervision/safety;Sit to/from stand   Toilet Transfer: Supervision/safety;Ambulation;RW;Comfort height toilet   Toileting- Clothing  Manipulation and Hygiene: Supervision/safety;Sit to/from stand       Functional mobility during ADLs: Supervision/safety;Rolling walker General ADL Comments: Supervision for safety, pt moves quickly, medicated.     Vision                     Perception     Praxis      Pertinent Vitals/Pain Pain Assessment: Faces Faces Pain Scale: Hurts a little bit Pain Location: R knee Pain Descriptors / Indicators: Sore Pain Intervention(s): Premedicated before session;Repositioned;Monitored during session, iced     Hand Dominance Right   Extremity/Trunk Assessment Upper Extremity Assessment Upper Extremity Assessment: Overall WFL for tasks assessed   Lower Extremity Assessment Lower Extremity Assessment: Defer to PT evaluation   Cervical / Trunk Assessment Cervical / Trunk Assessment: Normal   Communication Communication Communication: No difficulties   Cognition Arousal/Alertness: Awake/alert Behavior During Therapy: WFL for tasks assessed/performed Overall Cognitive Status: Within Functional Limits for tasks assessed                     General Comments       Exercises      Shoulder Instructions      Home Living Family/patient expects to be discharged to:: Private residence Living Arrangements: Spouse/significant other Available Help at Discharge: Family;Available 24 hours/day Type of Home: House Home Access: Stairs to enter Entergy CorporationEntrance Stairs-Number of Steps: 1 Entrance Stairs-Rails: None Home Layout: Two level;Able to live on main level with bedroom/bathroom     Bathroom Shower/Tub: Walk-in  shower   Bathroom Toilet: Standard     Home Equipment: Walker - 2 wheels;Bedside commode;Tub bench          Prior Functioning/Environment Level of Independence: Independent             OT Diagnosis:     OT Problem List:     OT Treatment/Interventions:      OT Goals(Current goals can be found in the care plan section) Acute Rehab OT  Goals Patient Stated Goal: To go home tomorrow  OT Frequency:     Barriers to D/C:            Co-evaluation              End of Session Equipment Utilized During Treatment: Rolling walker;Gait belt CPM Right Knee CPM Right Knee: Off  Activity Tolerance: Patient tolerated treatment well Patient left: in bed;with call bell/phone within reach   Time: 1025-1038 OT Time Calculation (min): 13 min Charges:  OT General Charges $OT Visit: 1 Procedure OT Evaluation $Initial OT Evaluation Tier I: 1 Procedure G-Codes:    Evern BioMayberry, Alanmichael Barmore Lynn 01/09/2014, 10:38 AM  [phone]

## 2014-01-09 NOTE — Progress Notes (Signed)
Patient was discharged home via a wheelchair with her son. AVS and prescriptions were with her, in hand.

## 2014-01-09 NOTE — Progress Notes (Signed)
Physical Therapy Treatment Patient Details Name: Courtney Morton MRN: [id number] DOB: 09/07/1960 Today's Date: 01/09/2014    History of Present Illness Patient is a 53 yo female admitted 01/08/14 now s/p Rt TKA.  PMH:  Hypothyroidism, anxiety, OA, depression, Lt TKA 02/27/13.    PT Comments    Pt con't to progress towards all goals. Pt demo'd good technique with stair negotiation.Pt with improved ambulation tolerance and began increasing R LE WBing. Pt safe to d/c home with assist of family once medically stable.  Follow Up Recommendations  Home health PT;Supervision/Assistance - 24 hour     Equipment Recommendations  None recommended by PT    Recommendations for Other Services       Precautions / Restrictions Precautions Precautions: Knee Precaution Booklet Issued: Yes (comment) Precaution Comments: Reviewed precautions with patient. Restrictions Weight Bearing Restrictions: Yes RLE Weight Bearing: Weight bearing as tolerated    Mobility  Bed Mobility Overal bed mobility: Modified Independent Bed Mobility: Supine to Sit     Supine to sit: Modified independent (Device/Increase time)     General bed mobility comments: use of bedrail, able to manage R LE mod I with use of UEs  Transfers Overall transfer level: Needs assistance Equipment used: Rolling walker (2 wheeled) Transfers: Sit to/from Stand Sit to Stand: Supervision         General transfer comment: v/c's for hand placement  Ambulation/Gait Ambulation/Gait assistance: Supervision Ambulation Distance (Feet): 180 Feet Assistive device: Rolling walker (2 wheeled) Gait Pattern/deviations: Step-to pattern;Decreased step length - right;Decreased stance time - right;Antalgic Gait velocity: Decreased Gait velocity interpretation: Below normal speed for age/gender General Gait Details: v/c's to increased R LE WBing and decrease bilat UE Wbing   Stairs Stairs: Yes Stairs assistance: Min guard Stair  Management: Step to pattern;With walker Number of Stairs:  (1 platform step) General stair comments: pt with good return demonstration  Wheelchair Mobility    Modified Rankin (Stroke Patients Only)       Balance                                    Cognition Arousal/Alertness: Awake/alert Behavior During Therapy: WFL for tasks assessed/performed Overall Cognitive Status: Within Functional Limits for tasks assessed                      Exercises Total Joint Exercises Ankle Circles/Pumps: AROM;Both;10 reps;Supine Quad Sets: AROM;Right;10 reps;Supine (with 5 sec hold) Heel Slides: AROM;AAROM;Right;10 reps;Supine Goniometric ROM: 52 deg R knee flexion active    General Comments        Pertinent Vitals/Pain Pain Assessment: 0-10 Pain Score: 7  (after PT) Pain Location: R knee Pain Intervention(s): Patient requesting pain meds-RN notified    Home Living                      Prior Function            PT Goals (current goals can now be found in the care plan section) Progress towards PT goals: Progressing toward goals    Frequency  7X/week    PT Plan Current plan remains appropriate    Co-evaluation             End of Session Equipment Utilized During Treatment: Gait belt Activity Tolerance: Patient tolerated treatment well Patient left: in chair;with call bell/phone within reach     Time: 0910-0940 PT Time Calculation (min) (  ACUTE ONLY): 30 min  Charges:  $Gait Training: 8-22 mins $Therapeutic Exercise: 8-22 mins                    G Codes:      Marcene BrawnChadwell, Maricela Kawahara Marie 01/09/2014, 10:02 AM  Lewis ShockAshly Tatjana Turcott, PT, DPT Pager #: [phone] Office #: [phone]

## 2014-01-09 NOTE — Discharge Instructions (Signed)
Diet: As you were doing prior to hospitalization   Activity:  Increase activity slowly as tolerated                  No lifting or driving for 6 weeks  Shower:  May shower without a dressing once there is no drainage from your wound.                 Do NOT wash over the wound.                 Dressing:  You may change your dressing on Wednesday                    Then change the dressing daily with sterile 4"x4"s gauze dressing                     And TED hose for knees.  Weight Bearing:  Weight bearing as tolerated as taught in physical therapy.  Use a                                walker or Crutches as instructed.  To prevent constipation: you may use a stool softener such as -               Colace ( over the counter) 100 mg by mouth twice a day                Drink plenty of fluids ( prune juice may be helpful) and high fiber foods                Miralax ( over the counter) for constipation as needed.    Precautions:  If you experience chest pain or shortness of breath - call 911 immediately               For transfer to the hospital emergency department!!               If you develop a fever greater that 101 F, purulent drainage from wound,                             increased redness or drainage from wound, or calf pain -- Call the office.  Follow- Up Appointment:  Please call for an appointment to be seen on 01/23/14                                              [facility]:  [phone]            [address] [place], [place] [address]

## 2014-01-10 NOTE — Progress Notes (Signed)
CARE MANAGEMENT NOTE 01/10/2014  Patient:  Deon PillingSANDERS,Arleth   Account Number:  [id number]  Date Initiated:  01/10/2014  Documentation initiated by:  [facility]itting  Subjective/Objective Assessment:   s/p rt TKA     Action/Plan:   PT eval-recommended HHPT   Anticipated DC Date:     Anticipated DC Plan:        DC Planning Services  CM consult      Choice offered to / List presented to:     DME arranged  CPM      DME agency  [employer]     HH arranged  HH-2 PT      Status of service:  Completed, signed off Medicare Important Message given?   (If response is "NO", the following Medicare IM given date fields will be blank) Date Medicare IM given:   Medicare IM given by:   Date Additional Medicare IM given:   Additional Medicare IM given by:    Discharge Disposition:    Per UR Regulation:  Reviewed for med. necessity/level of care/duration of stay  If discussed at Long Length of Stay Meetings, dates discussed:    Comments:  01/10/14 Spoke with patient about HHC, she stated that MD office [facility] Contact[facility] and verified with Nickie that patient is set up for HHPT. [employer] providing CPM, patient had a rolling walker and 3N1.

## 2014-01-23 NOTE — Discharge Summary (Signed)
SPORTS MEDICINE & JOINT REPLACEMENT   Georgena SpurlingStephen Lucey, MD   Altamese CabalMaurice Jerlyn Pain, PA-C [address], [place], [place]  [address]                             [phone]  PATIENT ID: Courtney Morton        MRN:  [id number]          DOB/AGE: 52/06/1960 / 52 y.o.    DISCHARGE SUMMARY  ADMISSION DATE:    01/08/2014 DISCHARGE DATE:  01/09/2014  ADMISSION DIAGNOSIS: primary osteoarthritis right knee    DISCHARGE DIAGNOSIS:  primary osteoarthritis right knee    ADDITIONAL DIAGNOSIS: Active Problems:   S/P total knee arthroplasty  Past Medical History  Diagnosis Date  . Anxiety   . Insomnia     Hx: of  . Depression   . Hypothyroidism     "not on RX anymore" (02/27/2013)  . H/O hiatal hernia   . 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 OA (osteoarthritis)     back, hands, knee    PROCEDURE: Procedure(s): TOTAL KNEE ARTHROPLASTY on 01/08/2014  CONSULTS:     HISTORY:  See H&P in chart  HOSPITAL COURSE:  Courtney Pillingancy Lapp is a 54 y.o. admitted on 01/08/2014 and found to have a diagnosis of primary osteoarthritis right knee.  After appropriate laboratory studies were obtained  they were taken to the operating room on 01/08/2014 and underwent Procedure(s): TOTAL KNEE ARTHROPLASTY.   They were given perioperative antibiotics:  Anti-infectives    Start     Dose/Rate Route Frequency Ordered Stop   01/08/14 1400  ceFAZolin (ANCEF) IVPB 1 g/50 mL premix     1 g100 mL/hr over 30 Minutes Intravenous Every 6 hours 01/08/14 1058 01/08/14 2200   01/08/14 0600  ceFAZolin (ANCEF) IVPB 2 g/50 mL premix     2 g100 mL/hr over 30 Minutes Intravenous On call to O.R. 01/07/14 1422 01/08/14 0745    .  Tolerated the procedure well.  Placed with a foley intraoperatively.  Given Ofirmev at induction and for 48 hours.    POD# 1: Vital signs were stable.  Patient denied Chest pain, shortness of breath, or calf pain.  Patient was started on Lovenox 30 mg  subcutaneously twice daily at 8am.  Consults to PT, OT, and care management were made.  The patient was weight bearing as tolerated.  CPM was placed on the operative leg 0-90 degrees for 6-8 hours a day.  Incentive spirometry was taught.  Dressing was changed.  Hemovac was discontinued.      POD #2, Continued  PT for ambulation and exercise program.  IV saline locked.  O2 discontinued.    The remainder of the hospital course was dedicated to ambulation and strengthening.   The patient was discharged on 1 day post op in  Good condition.  Blood products given:none  DIAGNOSTIC STUDIES: Recent vital signs: No data found.      Recent laboratory studies: No results for input(s): WBC, HGB, HCT, PLT in the last 168 hours. No results for input(s): NA, K, CL, CO2, BUN, CREATININE, GLUCOSE, CALCIUM in the last 168 hours. Lab Results  Component Value Date   INR 0.96 01/01/2014   INR 0.97 02/22/2013     Recent Radiographic Studies :  No results found.  DISCHARGE INSTRUCTIONS:   DISCHARGE MEDICATIONS:     Medication List  STOP taking these medications        HYDROcodone-acetaminophen 10-325 MG per tablet  Commonly known as:  NORCO      TAKE these medications        ALPRAZolam 0.5 MG tablet  Commonly known as:  XANAX  Take 0.5 mg by mouth at bedtime as needed for anxiety.     enoxaparin 40 MG/0.4ML injection  Commonly known as:  LOVENOX  Inject 0.4 mLs (40 mg total) into the skin daily.     escitalopram 20 MG tablet  Commonly known as:  LEXAPRO  Take 20 mg by mouth daily before breakfast.     estradiol 0.1 MG/24HR patch  Commonly known as:  VIVELLE-DOT  Place 1 patch onto the skin 2 (two) times a week. Sunday and Wednesday     methocarbamol 500 MG tablet  Commonly known as:  ROBAXIN  Take 1-2 tablets (500-1,000 mg total) by mouth every 6 (six) hours as needed for muscle spasms.     OxyCODONE 10 mg T12a 12 hr tablet  Commonly known as:  OXYCONTIN  Take 1 tablet (10  mg total) by mouth every 12 (twelve) hours.     oxyCODONE-acetaminophen 10-325 MG per tablet  Commonly known as:  PERCOCET  Take 1 tablet by mouth every 4 (four) hours as needed for pain.        FOLLOW UP VISIT:       Follow-up Information    Follow up with Raymon Mutton, MD. Call on 01/23/2014.   Specialty:  Orthopedic Surgery   Contact information:   [address] [place] [address] [phone]       DISPOSITION: HOME CONDITION:  Good   Amran Malter 01/23/2014, 3:27 PM

## 2014-08-14 ENCOUNTER — Other Ambulatory Visit: Payer: Self-pay | Admitting: Orthopedic Surgery

## 2014-08-14 DIAGNOSIS — M545 Low back pain: Secondary | ICD-10-CM

## 2014-08-24 ENCOUNTER — Ambulatory Visit
Admission: RE | Admit: 2014-08-24 | Discharge: 2014-08-24 | Disposition: A | Discharge status: Home / Self Care | Payer: BLUE CROSS/BLUE SHIELD | Source: Ambulatory Visit | Attending: Orthopedic Surgery | Admitting: Orthopedic Surgery

## 2014-08-24 DIAGNOSIS — M545 Low back pain: Secondary | ICD-10-CM

## 2017-03-23 IMAGING — MR MR LUMBAR SPINE W/O CM
5 series · 42 of 48 positions shown · non-contrast
Comparison: [facility] [[facility]OS[facility]] chest CTA 07/19/2008

CLINICAL DATA: 54-year-old female with lumbar back pain radiating
to the left lower extremity for 5 months. No known injury. Spinal
injection 2 weeks ago. Initial encounter.

EXAM:
MRI LUMBAR SPINE WITHOUT CONTRAST
TECHNIQUE: Multiplanar, multisequence MR imaging of the lumbar spine was
performed. No intravenous contrast was administered.

[Series 2: T2 post-contrast · sagittal · 4.0mm · 0.88mm/px · 6 of 12 slices shown]
[im 1/12]
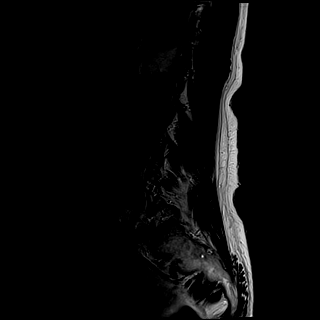
[im 3/12]
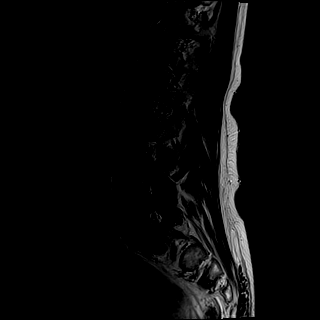
[im 5/12]
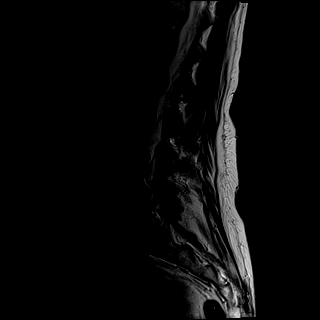
[im 7/12]
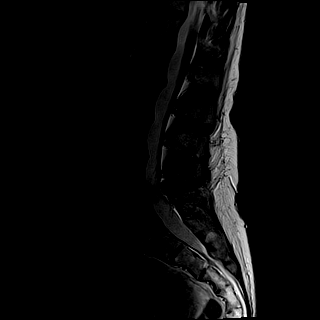
[im 9/12]
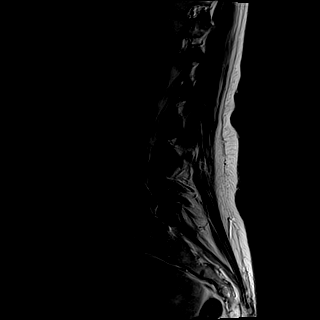
[im 12/12]
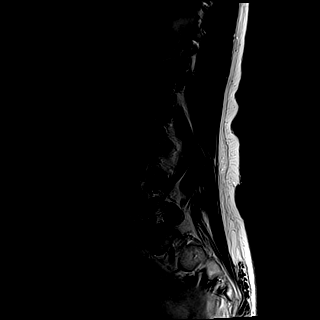

[Series 3: T1 · sagittal · 4.0mm · 0.88mm/px · 6 of 12 slices shown (1 of 2)]
[im 1/12]
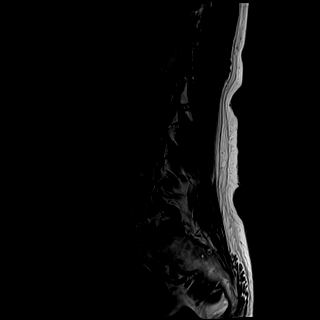
[im 3/12]
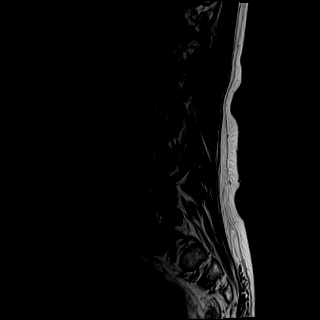
[im 5/12]
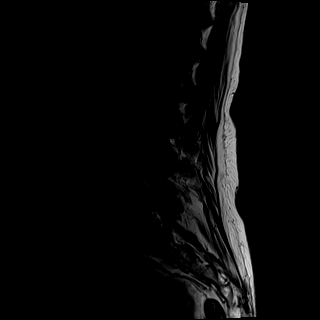
[im 7/12]
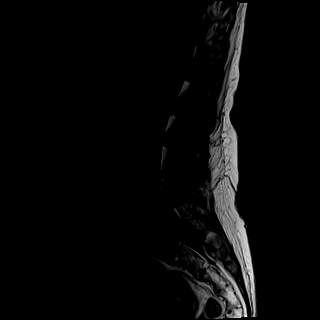
[im 9/12]
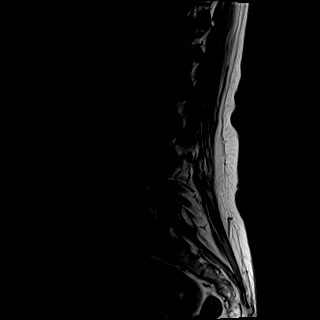
[im 12/12]
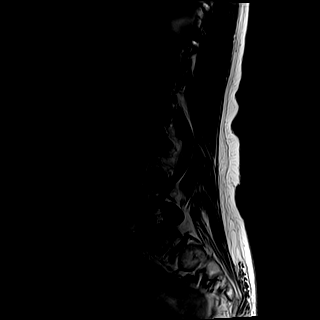

[Series 4: T1 · axial · 4.0mm · 0.74mm/px · z∈[-67,+172]mm · 9 of 32 slices shown (2 of 2)]
[im 1/32]
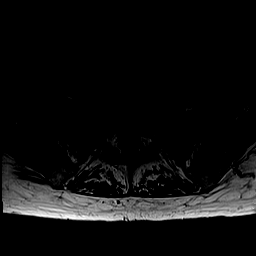
[im 5/32]
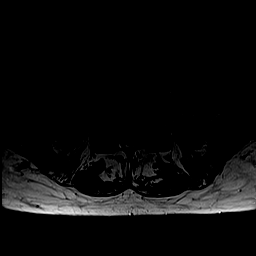
[im 9/32]
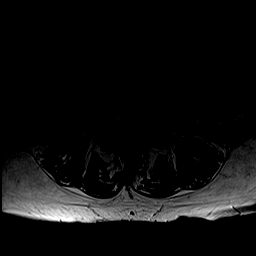
[im 14/32]
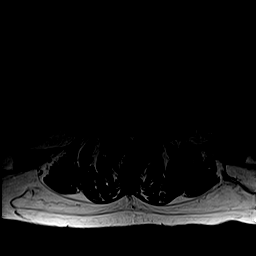
[im 16/32]
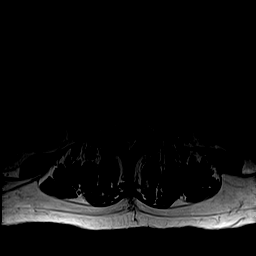
[im 18/32]
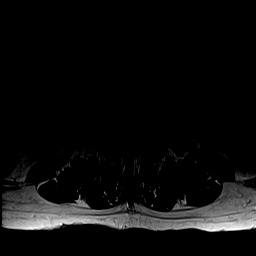
[im 23/32]
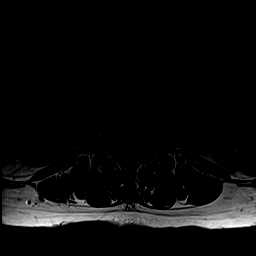
[im 27/32]
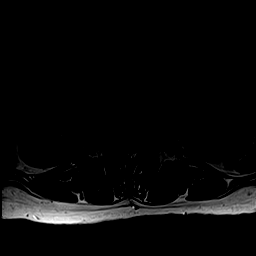
[im 32/32]
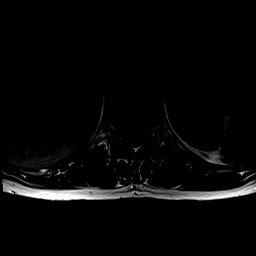

[Series 5: STIR · sagittal · 4.0mm · 0.55mm/px · 6 of 12 slices shown]
[im 1/12]
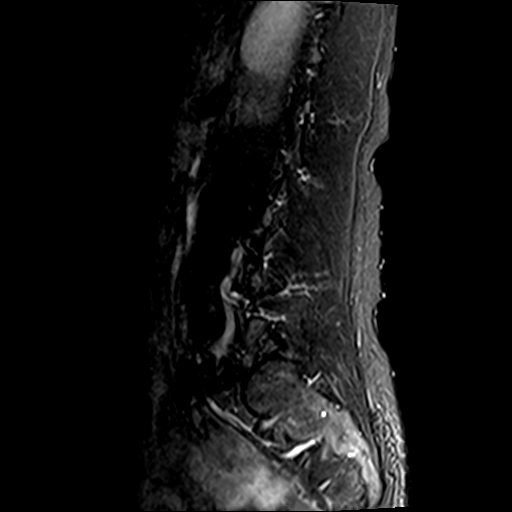
[im 3/12]
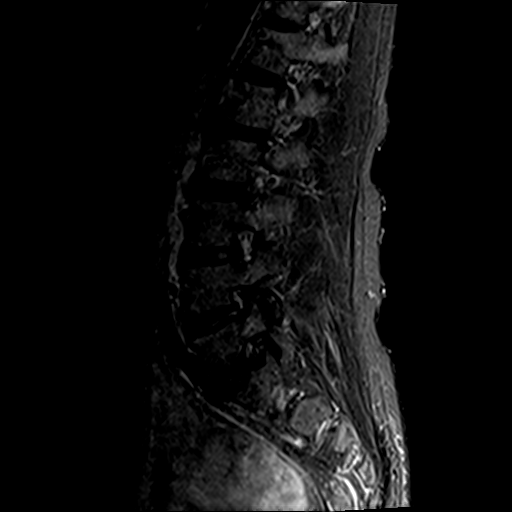
[im 5/12]
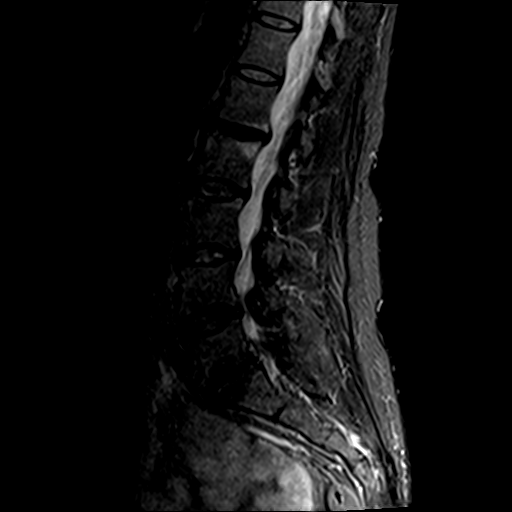
[im 7/12]
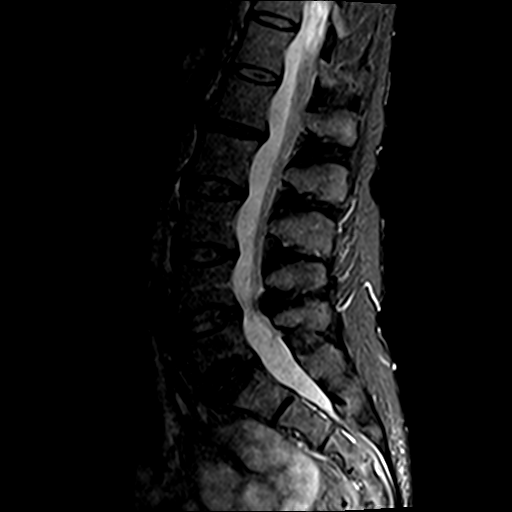
[im 9/12]
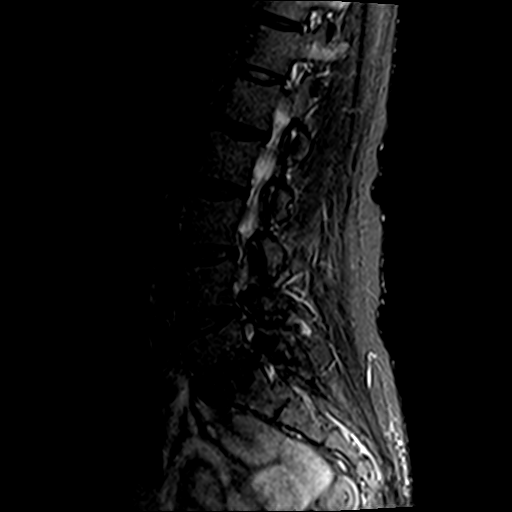
[im 12/12]
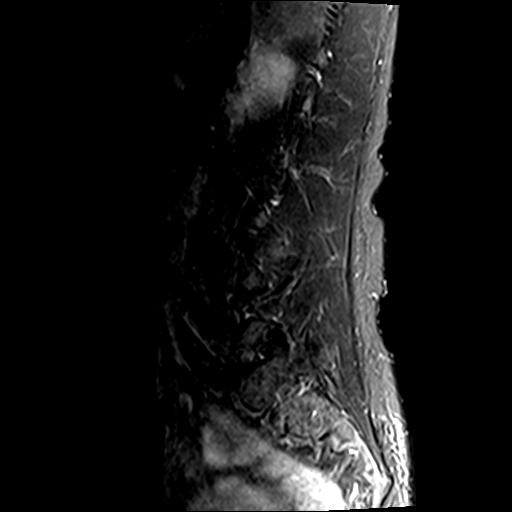

[Series 6: T2 · axial · 4.0mm · 0.74mm/px · z∈[-67,+172]mm · 15 of 32 slices shown]
[im 1/32]
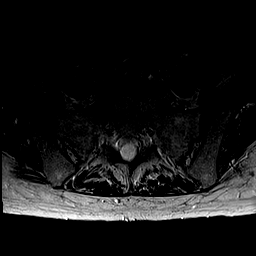
[im 3/32]
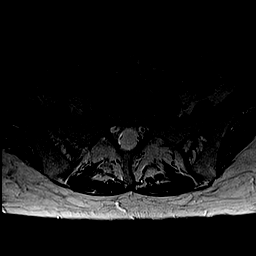
[im 5/32]
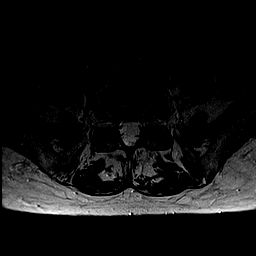
[im 7/32]
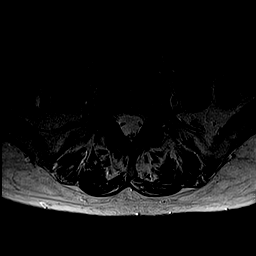
[im 9/32]
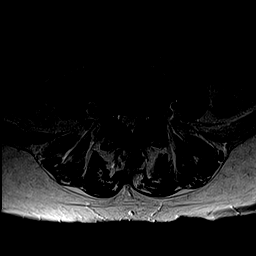
[im 12/32]
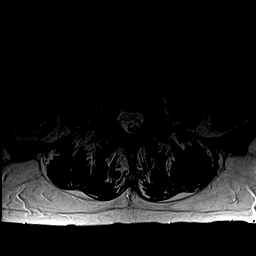
[im 14/32]
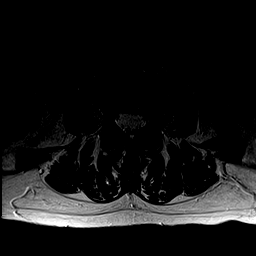
[im 16/32]
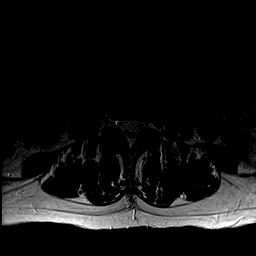
[im 18/32]
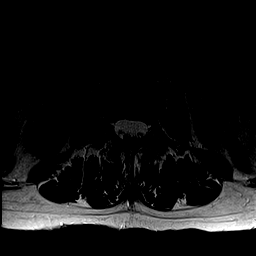
[im 20/32]
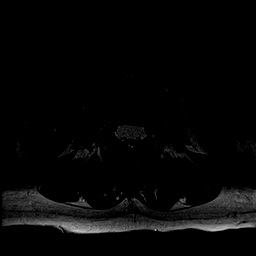
[im 23/32]
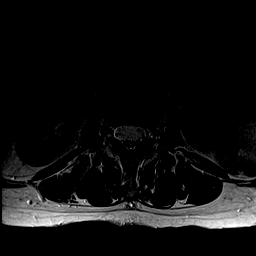
[im 25/32]
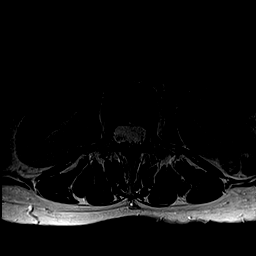
[im 27/32]
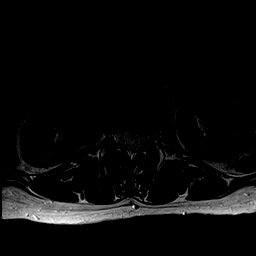
[im 29/32]
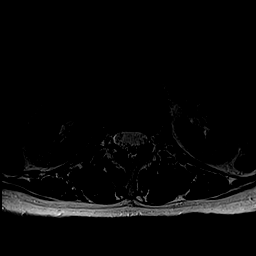
[im 32/32]
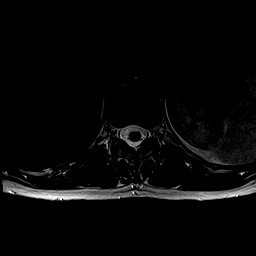

[42 of 48 positions shown; findings below may reference images not displayed]

FINDINGS: Lumbar segmentation appears to be normal and will be designated as
such for this report. Chronic degenerative endplate marrow changes
at L5-S1. Trace retrolisthesis at L1-L2. Elsewhere normal vertebral
height, alignment, and marrow signal. No acute osseous abnormality
identified. Visible sacrum intact.

Visualized lower thoracic spinal cord is normal with conus medularis
at L1. Generally capacious thoracic thecal sac.

Visualized abdominal viscera and paraspinal soft tissues are within
normal limits.

T11-T12:  Mild facet hypertrophy, otherwise negative.

T12-L1:  Mild facet hypertrophy, otherwise negative.

L1-L2: Disc desiccation, disc space loss, circumferential disc
osteophyte complex, and mild broad-based left paracentral disc
protrusion (series 5, image 6). Still, the thecal sac is capacious.
No spinal or foraminal stenosis.

L2-L3:  Mild disc bulge.  Moderate facet hypertrophy.  No stenosis.

L3-L4: Mild disc bulge. Moderate facet hypertrophy. Borderline to
mild L3 foraminal stenosis.

L4-L5: Mild disc desiccation and disc bulging, mostly far lateral.
Moderate to severe facet and ligament flavum hypertrophy. Moderate
spinal stenosis (series 6, image 23). Moderate L4 foraminal
stenosis.

L5-S1: Near complete disc space loss. Circumferential, mostly far
lateral disc osteophyte complex. Mild ligament flavum hypertrophy
greater on the right. No spinal or lateral recess stenosis. Mild L5
foraminal stenosis primarily due to endplate spurring.
IMPRESSION: 1. Moderate L4-L5 spinal and foraminal stenosis related to up to
severe posterior element degeneration and mild to moderate disc
bulging.
2. Chronic disc and endplate degeneration at L5-S1 with only mild
foraminal stenosis. Chronic disc and endplate degeneration at L1-L2
without stenosis.
3. Moderate lumbar facet degeneration elsewhere.
